# Patient Record
Sex: Female | Born: 1950 | Hispanic: No | Marital: Single | Smoking: Former smoker
Health system: Southern US, Community
[De-identification: ages and names within clinical notes are randomized; demographics above are authoritative.]

## PROBLEM LIST (undated history)

## (undated) DIAGNOSIS — K219 Gastro-esophageal reflux disease without esophagitis: Secondary | ICD-10-CM

## (undated) DIAGNOSIS — M1990 Unspecified osteoarthritis, unspecified site: Secondary | ICD-10-CM

## (undated) DIAGNOSIS — G709 Myoneural disorder, unspecified: Secondary | ICD-10-CM

## (undated) HISTORY — PX: VAGINAL HYSTERECTOMY: SUR661

## (undated) HISTORY — PX: BREAST EXCISIONAL BIOPSY: SUR124

## (undated) HISTORY — DX: Gastro-esophageal reflux disease without esophagitis: K21.9

## (undated) HISTORY — DX: Unspecified osteoarthritis, unspecified site: M19.90

## (undated) HISTORY — PX: COLONOSCOPY: SHX174

---

## 1998-08-12 ENCOUNTER — Ambulatory Visit (HOSPITAL_COMMUNITY): Admission: RE | Admit: 1998-08-12 | Discharge: 1998-08-12 | Payer: Self-pay | Admitting: Internal Medicine

## 1998-08-12 ENCOUNTER — Encounter: Payer: Self-pay | Admitting: Internal Medicine

## 1998-08-22 ENCOUNTER — Other Ambulatory Visit: Admission: RE | Admit: 1998-08-22 | Discharge: 1998-08-22 | Payer: Self-pay | Admitting: Obstetrics and Gynecology

## 1999-09-03 ENCOUNTER — Other Ambulatory Visit: Admission: RE | Admit: 1999-09-03 | Discharge: 1999-09-03 | Payer: Self-pay | Admitting: Obstetrics and Gynecology

## 2000-09-07 ENCOUNTER — Encounter: Admission: RE | Admit: 2000-09-07 | Discharge: 2000-09-07 | Payer: Self-pay | Admitting: Obstetrics and Gynecology

## 2000-09-07 ENCOUNTER — Encounter: Payer: Self-pay | Admitting: Obstetrics and Gynecology

## 2001-09-28 ENCOUNTER — Other Ambulatory Visit: Admission: RE | Admit: 2001-09-28 | Discharge: 2001-09-28 | Payer: Self-pay | Admitting: Obstetrics and Gynecology

## 2002-10-03 ENCOUNTER — Other Ambulatory Visit: Admission: RE | Admit: 2002-10-03 | Discharge: 2002-10-03 | Payer: Self-pay | Admitting: Obstetrics and Gynecology

## 2003-07-24 ENCOUNTER — Emergency Department (HOSPITAL_COMMUNITY): Admission: EM | Admit: 2003-07-24 | Discharge: 2003-07-24 | Payer: Self-pay | Admitting: Family Medicine

## 2004-03-24 ENCOUNTER — Ambulatory Visit: Payer: Self-pay | Admitting: Internal Medicine

## 2008-09-04 ENCOUNTER — Emergency Department: Payer: Self-pay | Admitting: Emergency Medicine

## 2008-09-05 ENCOUNTER — Emergency Department: Payer: Self-pay | Admitting: Emergency Medicine

## 2010-09-09 ENCOUNTER — Emergency Department: Payer: Self-pay | Admitting: Internal Medicine

## 2010-10-08 ENCOUNTER — Ambulatory Visit: Payer: Self-pay | Admitting: Specialist

## 2011-01-15 ENCOUNTER — Emergency Department: Payer: Self-pay | Admitting: Unknown Physician Specialty

## 2011-05-23 ENCOUNTER — Emergency Department: Payer: Self-pay | Admitting: *Deleted

## 2012-10-22 ENCOUNTER — Emergency Department: Payer: Self-pay | Admitting: Emergency Medicine

## 2013-01-15 ENCOUNTER — Ambulatory Visit: Payer: Self-pay | Admitting: General Practice

## 2013-01-23 ENCOUNTER — Other Ambulatory Visit: Payer: Self-pay | Admitting: Physician Assistant

## 2013-01-23 LAB — URINALYSIS, COMPLETE
Bacteria: NONE SEEN
Bilirubin,UR: NEGATIVE
Glucose,UR: NEGATIVE mg/dL (ref 0–75)
Ketone: NEGATIVE
Leukocyte Esterase: NEGATIVE
Nitrite: NEGATIVE
Ph: 5 (ref 4.5–8.0)
Protein: NEGATIVE
RBC,UR: 5 /HPF (ref 0–5)
Specific Gravity: 1.021 (ref 1.003–1.030)
Squamous Epithelial: 2
WBC UR: <1 /HPF (ref 0–5)

## 2013-01-24 LAB — URINE CULTURE

## 2013-02-07 ENCOUNTER — Ambulatory Visit: Payer: Self-pay | Admitting: Urology

## 2013-02-07 DIAGNOSIS — R109 Unspecified abdominal pain: Secondary | ICD-10-CM | POA: Insufficient documentation

## 2013-03-07 ENCOUNTER — Ambulatory Visit: Payer: Self-pay | Admitting: Family Medicine

## 2013-04-20 ENCOUNTER — Ambulatory Visit: Payer: Self-pay

## 2013-05-08 ENCOUNTER — Ambulatory Visit: Payer: Self-pay | Admitting: Family Medicine

## 2013-05-21 ENCOUNTER — Ambulatory Visit: Payer: Self-pay | Admitting: Family Medicine

## 2014-11-15 ENCOUNTER — Ambulatory Visit (INDEPENDENT_AMBULATORY_CARE_PROVIDER_SITE_OTHER): Payer: Self-pay | Admitting: Family Medicine

## 2014-11-15 ENCOUNTER — Encounter: Payer: Self-pay | Admitting: Family Medicine

## 2014-11-15 VITALS — BP 130/86 | HR 84 | Ht 67.0 in | Wt 172.0 lb

## 2014-11-15 DIAGNOSIS — N3001 Acute cystitis with hematuria: Secondary | ICD-10-CM

## 2014-11-15 DIAGNOSIS — S39012A Strain of muscle, fascia and tendon of lower back, initial encounter: Secondary | ICD-10-CM

## 2014-11-15 LAB — POCT URINALYSIS DIPSTICK
Bilirubin, UA: NEGATIVE
Glucose, UA: NEGATIVE
Ketones, UA: NEGATIVE
Leukocytes, UA: NEGATIVE
Nitrite, UA: NEGATIVE
Protein, UA: NEGATIVE
Spec Grav, UA: 1.01
Urobilinogen, UA: 0.2
pH, UA: 5

## 2014-11-15 MED ORDER — SULFAMETHOXAZOLE-TRIMETHOPRIM 800-160 MG PO TABS: 1.0000 | ORAL_TABLET | Freq: Two times a day (BID) | ORAL | Status: DC

## 2014-11-15 MED ORDER — CYCLOBENZAPRINE HCL 10 MG PO TABS: 10.0000 mg | ORAL_TABLET | Freq: Three times a day (TID) | ORAL | Status: DC | PRN

## 2014-11-15 NOTE — Progress Notes (Signed)
Name: Taylor Marshall   MRN: [id number]    DOB: 11/25/1950   Date:11/15/2014       Progress Note  Subjective  Chief Complaint  Chief Complaint  Patient presents with  . Urinary Tract Infection    lower abdominal pressure across the front and lower back pain. Nauseated and painful urination    Urinary Tract Infection  This is a new problem. The current episode started 1 to 4 weeks ago. The problem occurs every urination. The problem has been waxing and waning. The quality of the pain is described as burning. The pain is mild. There has been no fever. She is not sexually active. There is no history of pyelonephritis. Associated symptoms include chills, frequency and urgency. Pertinent negatives include no discharge, flank pain, hematuria, hesitancy, nausea or possible pregnancy. She has tried acetaminophen for the symptoms. The treatment provided mild relief.  Back Pain This is a new problem. The current episode started 1 to 4 weeks ago. The problem has been waxing and waning since onset. The pain is present in the gluteal. The pain is moderate. Associated symptoms include leg pain. Pertinent negatives include no abdominal pain, bladder incontinence, bowel incontinence, chest pain, dysuria, fever, headaches, paresthesias, tingling, weakness or weight loss.    No problem-specific assessment & plan notes found for this encounter.   No past medical history on file.  Past Surgical History  Procedure Laterality Date  . Vaginal hysterectomy    . Colonoscopy  2006?    Dr Maryruth BunKapur    No family history on file.  Social History   Social History  . Marital Status: Single    Spouse Name: N/A  . Number of Children: N/A  . Years of Education: N/A   Occupational History  . Not on file.   Social History Main Topics  . Smoking status: Former Games developermoker  . Smokeless tobacco: Not on file  . Alcohol Use: No  . Drug Use: No  . Sexual Activity: Not on file   Other Topics Concern  . Not on file    Social History Narrative  . No narrative on file    No Known Allergies   Review of Systems  Constitutional: Positive for chills. Negative for fever, weight loss and malaise/fatigue.  HENT: Negative for ear discharge, ear pain and sore throat.   Eyes: Negative for blurred vision.  Respiratory: Negative for cough, sputum production, shortness of breath and wheezing.   Cardiovascular: Negative for chest pain, palpitations and leg swelling.  Gastrointestinal: Negative for heartburn, nausea, abdominal pain, diarrhea, constipation, blood in stool, melena and bowel incontinence.  Genitourinary: Positive for urgency and frequency. Negative for bladder incontinence, dysuria, hesitancy, hematuria and flank pain.  Musculoskeletal: Positive for back pain. Negative for myalgias, joint pain and neck pain.  Skin: Negative for rash.  Neurological: Negative for dizziness, tingling, sensory change, focal weakness, weakness, headaches and paresthesias.  Endo/Heme/Allergies: Negative for environmental allergies and polydipsia. Does not bruise/bleed easily.  Psychiatric/Behavioral: Negative for depression and suicidal ideas. The patient is not nervous/anxious and does not have insomnia.      Objective  Filed Vitals:   11/15/14 1538  BP: 130/86  Pulse: 84  Height: 5\' 7"  (1.702 m)  Weight: 172 lb (78.019 kg)    Physical Exam  Constitutional: She is well-developed, well-nourished, and in no distress. No distress.  HENT:  Head: Normocephalic and atraumatic.  Right Ear: External ear normal.  Left Ear: External ear normal.  Nose: Nose normal.  Mouth/Throat: Oropharynx  is clear and moist.  Eyes: Conjunctivae and EOM are normal. Pupils are equal, round, and reactive to light. Right eye exhibits no discharge. Left eye exhibits no discharge.  Neck: Normal range of motion. Neck supple. No JVD present. No thyromegaly present.  Cardiovascular: Normal rate, regular rhythm, normal heart sounds and intact  distal pulses.  Exam reveals no gallop and no friction rub.   No murmur heard. Pulmonary/Chest: Effort normal and breath sounds normal.  Abdominal: Soft. Bowel sounds are normal. She exhibits no mass. There is no tenderness. There is no guarding.  Musculoskeletal: Normal range of motion. She exhibits no edema.  Lymphadenopathy:    She has no cervical adenopathy.  Neurological: She is alert.  Skin: Skin is warm and dry. She is not diaphoretic.  Psychiatric: Mood and affect normal.      Assessment & Plan  Problem List Items Addressed This Visit    None    Visit Diagnoses    Acute cystitis with hematuria    -  Primary    pt to call for appt with [facility]io[facility]    Relevant Medications    sulfamethoxazole-trimethoprim (BACTRIM DS,SEPTRA DS) 800-160 MG tablet    Other Relevant Orders    POCT Urinalysis Dipstick (Completed)    Lumbosacral strain, initial encounter        Relevant Medications    cyclobenzaprine (FLEXERIL) 10 MG tablet         Dr. [facility]  11/15/2014

## 2014-11-26 ENCOUNTER — Other Ambulatory Visit: Payer: Self-pay

## 2014-11-27 ENCOUNTER — Encounter: Payer: Self-pay | Admitting: Family Medicine

## 2014-11-27 ENCOUNTER — Ambulatory Visit (INDEPENDENT_AMBULATORY_CARE_PROVIDER_SITE_OTHER): Payer: Self-pay | Admitting: Family Medicine

## 2014-11-27 ENCOUNTER — Other Ambulatory Visit
Admission: RE | Admit: 2014-11-27 | Discharge: 2014-11-27 | Disposition: A | Discharge status: Home / Self Care | Payer: Self-pay | Source: Ambulatory Visit | Attending: Family Medicine | Admitting: Family Medicine

## 2014-11-27 VITALS — BP 120/80 | HR 80 | Ht 67.0 in | Wt 179.0 lb

## 2014-11-27 DIAGNOSIS — R7989 Other specified abnormal findings of blood chemistry: Secondary | ICD-10-CM | POA: Insufficient documentation

## 2014-11-27 DIAGNOSIS — R110 Nausea: Secondary | ICD-10-CM | POA: Insufficient documentation

## 2014-11-27 DIAGNOSIS — K8500 Idiopathic acute pancreatitis without necrosis or infection: Secondary | ICD-10-CM

## 2014-11-27 DIAGNOSIS — K219 Gastro-esophageal reflux disease without esophagitis: Secondary | ICD-10-CM

## 2014-11-27 DIAGNOSIS — R1031 Right lower quadrant pain: Secondary | ICD-10-CM | POA: Insufficient documentation

## 2014-11-27 LAB — RENAL FUNCTION PANEL
Albumin: 4 g/dL (ref 3.5–5.0)
Anion gap: 7 (ref 5–15)
BUN: 8 mg/dL (ref 6–20)
CO2: 27 mmol/L (ref 22–32)
Calcium: 10.1 mg/dL (ref 8.9–10.3)
Chloride: 107 mmol/L (ref 101–111)
Creatinine, Ser: 0.87 mg/dL (ref 0.44–1.00)
GFR calc Af Amer: >60 mL/min (ref >60)
GFR calc non Af Amer: >60 mL/min (ref >60)
Glucose, Bld: 104 mg/dL — ABNORMAL HIGH (ref 65–99)
Phosphorus: 3.3 mg/dL (ref 2.5–4.6)
Potassium: 4.9 mmol/L (ref 3.5–5.1)
Sodium: 141 mmol/L (ref 135–145)

## 2014-11-27 LAB — AMYLASE: Amylase: 84 U/L (ref 28–100)

## 2014-11-27 LAB — HEPATIC FUNCTION PANEL
ALT: 18 U/L (ref 14–54)
AST: 27 U/L (ref 15–41)
Albumin: 4 g/dL (ref 3.5–5.0)
Alkaline Phosphatase: 80 U/L (ref 38–126)
Bilirubin, Direct: <0.1 mg/dL — ABNORMAL LOW (ref 0.1–0.5)
Total Bilirubin: 0.3 mg/dL (ref 0.3–1.2)
Total Protein: 7.4 g/dL (ref 6.5–8.1)

## 2014-11-27 LAB — CBC
HCT: 40.7 % (ref 35.0–47.0)
Hemoglobin: 13.4 g/dL (ref 12.0–16.0)
MCH: 27.1 pg (ref 26.0–34.0)
MCHC: 32.9 g/dL (ref 32.0–36.0)
MCV: 82.3 fL (ref 80.0–100.0)
Platelets: 226 10*3/uL (ref 150–440)
RBC: 4.94 MIL/uL (ref 3.80–5.20)
RDW: 12.9 % (ref 11.5–14.5)
WBC: 7.9 10*3/uL (ref 3.6–11.0)

## 2014-11-27 LAB — LIPASE, BLOOD: Lipase: 24 U/L (ref 11–51)

## 2014-11-27 MED ORDER — PANTOPRAZOLE SODIUM 40 MG PO TBEC: 40.0000 mg | DELAYED_RELEASE_TABLET | Freq: Every day | ORAL | Status: DC

## 2014-11-27 NOTE — Progress Notes (Signed)
Name: Taylor PickerDeborah A Peregoy   MRN: [id number]    DOB: 12/26/1950   Date:11/27/2014       Progress Note  Subjective  Chief Complaint  Chief Complaint  Patient presents with  . Follow-up    ER visit- pain on R) side radiating to back- no kidney stones. Lipase elevated    Abdominal Pain This is a recurrent problem. The current episode started 1 to 4 weeks ago. The onset quality is gradual. The problem occurs daily. The problem has been waxing and waning. The pain is located in the RUQ. The pain is at a severity of 4/10. The pain is moderate. The quality of the pain is colicky and aching ("comes in waves"). Associated symptoms include anorexia, hematuria, nausea and vomiting. Pertinent negatives include no belching, constipation, diarrhea, dysuria, fever, frequency, headaches, hematochezia, melena, myalgias or weight loss. Associated symptoms comments: [facility] Exacerbated by: zofran/hydrocodone. She has tried oral narcotic analgesics and acetaminophen for the symptoms. The treatment provided mild relief. Her past medical history is significant for pancreatitis. There is no history of gallstones.    No problem-specific assessment & plan notes found for this encounter.   History reviewed. No pertinent past medical history.  Past Surgical History  Procedure Laterality Date  . Vaginal hysterectomy    . Colonoscopy  2006?    Dr Maryruth BunKapur    History reviewed. No pertinent family history.  Social History   Social History  . Marital Status: Single    Spouse Name: N/A  . Number of Children: N/A  . Years of Education: N/A   Occupational History  . Not on file.   Social History Main Topics  . Smoking status: Former Games developermoker  . Smokeless tobacco: Not on file  . Alcohol Use: No  . Drug Use: No  . Sexual Activity: Not Currently   Other Topics Concern  . Not on file   Social History Narrative    Allergies  Allergen Reactions  . Morphine And Related Nausea Only      Review of Systems  Constitutional: Negative for fever, chills, weight loss and malaise/fatigue.  HENT: Negative for ear discharge, ear pain and sore throat.   Eyes: Negative for blurred vision.  Respiratory: Negative for cough, sputum production, shortness of breath and wheezing.   Cardiovascular: Negative for chest pain, palpitations and leg swelling.  Gastrointestinal: Positive for heartburn, nausea, vomiting, abdominal pain and anorexia. Negative for diarrhea, constipation, blood in stool, melena and hematochezia.  Genitourinary: Positive for hematuria. Negative for dysuria, urgency and frequency.  Musculoskeletal: Negative for myalgias, back pain, joint pain and neck pain.  Skin: Negative for rash.  Neurological: Negative for dizziness, tingling, sensory change, focal weakness and headaches.  Endo/Heme/Allergies: Negative for environmental allergies and polydipsia. Does not bruise/bleed easily.  Psychiatric/Behavioral: Negative for depression and suicidal ideas. The patient is not nervous/anxious and does not have insomnia.      Objective  Filed Vitals:   11/27/14 0831  BP: 120/80  Pulse: 80  Height: 5\' 7"  (1.702 m)  Weight: 179 lb (81.194 kg)    Physical Exam  Constitutional: She is well-developed, well-nourished, and in no distress. No distress.  HENT:  Head: Normocephalic and atraumatic.  Right Ear: External ear normal.  Left Ear: External ear normal.  Nose: Nose normal.  Mouth/Throat: Oropharynx is clear and moist.  Eyes: Conjunctivae and EOM are normal. Pupils are equal, round, and reactive to light. Right eye exhibits no discharge. Left eye exhibits no discharge.  Neck: Normal  range of motion. Neck supple. No JVD present. No thyromegaly present.  Cardiovascular: Normal rate, regular rhythm, normal heart sounds and intact distal pulses.  Exam reveals no gallop and no friction rub.   No murmur heard. Pulmonary/Chest: Effort normal and breath sounds normal.   Abdominal: Soft. Bowel sounds are normal. She exhibits no distension and no mass. There is tenderness. There is rebound. There is no guarding.  Musculoskeletal: Normal range of motion. She exhibits no edema.  Lymphadenopathy:    She has no cervical adenopathy.  Neurological: She is alert. She has normal reflexes.  Skin: Skin is warm and dry. She is not diaphoretic.  Psychiatric: Mood and affect normal.      Assessment & Plan  Problem List Items Addressed This Visit    None    Visit Diagnoses    Idiopathic acute pancreatitis, unspecified complication status    -  Primary    discussed concerns and need to hosp for NPO/ pt refuses    Relevant Medications    oxyCODONE (OXY IR/ROXICODONE) 5 MG immediate release tablet    pantoprazole (PROTONIX) 40 MG tablet    Other Relevant Orders    CBC with Differential    Lipase    Hepatic function panel    Renal Function Panel    Gastroesophageal reflux disease, esophagitis presence not specified        Relevant Medications    pantoprazole (PROTONIX) 40 MG tablet         Dr. [facility]  11/27/2014

## 2014-11-27 NOTE — Patient Instructions (Signed)

## 2016-01-10 IMAGING — CT CT STONE STUDY
2 of 4 series · 16 of 46 positions shown, 18 images · non-contrast
Comparison: None.

CLINICAL DATA: Left flank pain and hematuria.

EXAM:
CT ABDOMEN AND PELVIS WITHOUT CONTRAST
TECHNIQUE: Multidetector CT imaging of the abdomen and pelvis was performed
following the standard protocol without IV contrast.

[Series 2: stone standard full · axial · 0.71mm/px · z∈[-554,-159]mm · 13 of 87 slices shown, 15 images]
[im 4/87  soft-tissue]
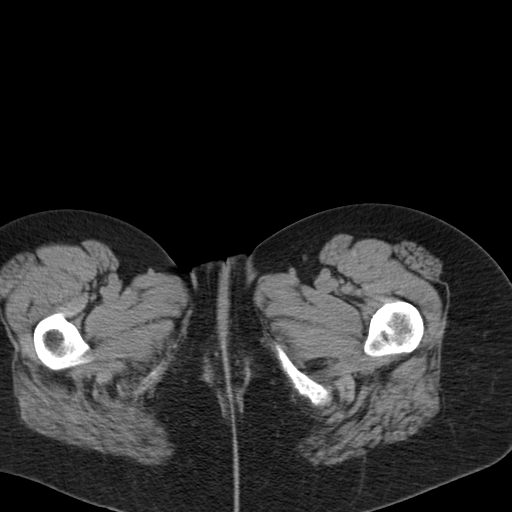
[im 4/87  bone]
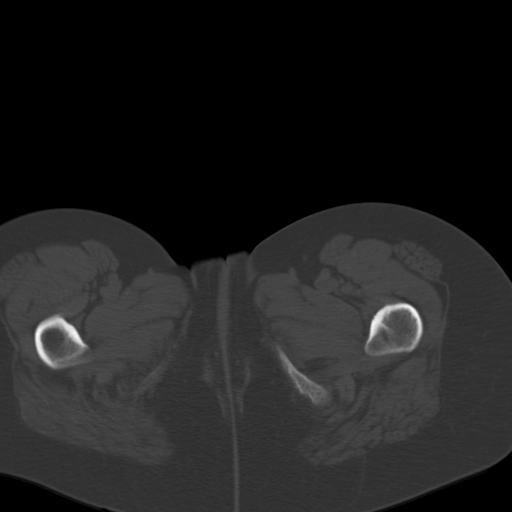
[im 11/87  soft-tissue]
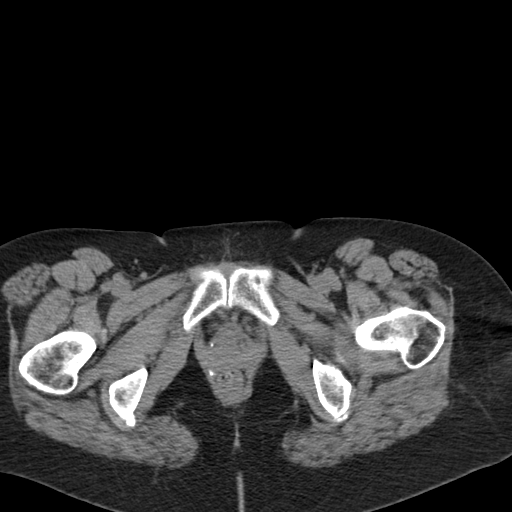
[im 18/87  soft-tissue]
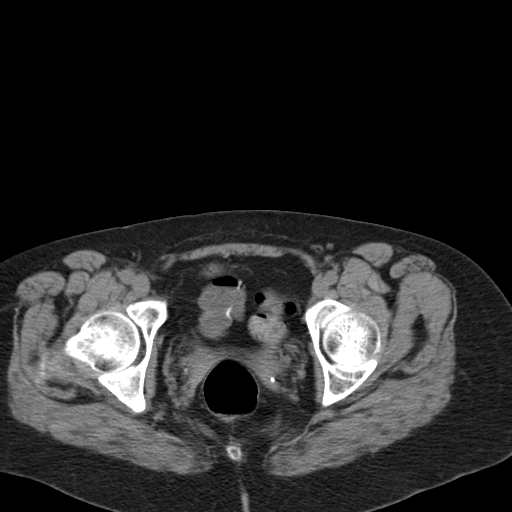
[im 26/87  soft-tissue]
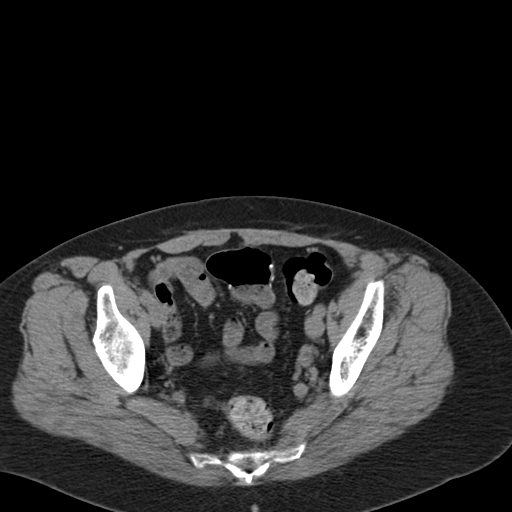
[im 29/87  soft-tissue]
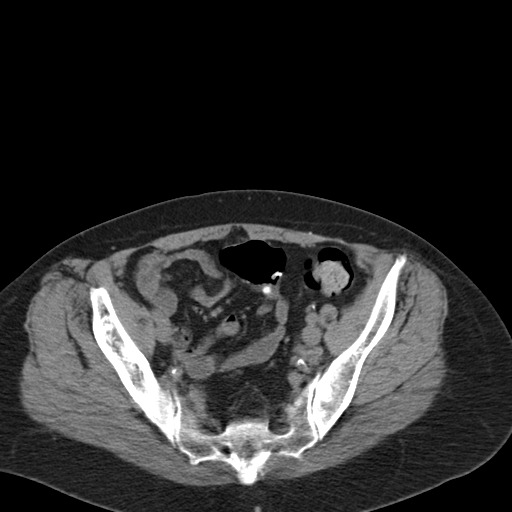
[im 36/87  soft-tissue]
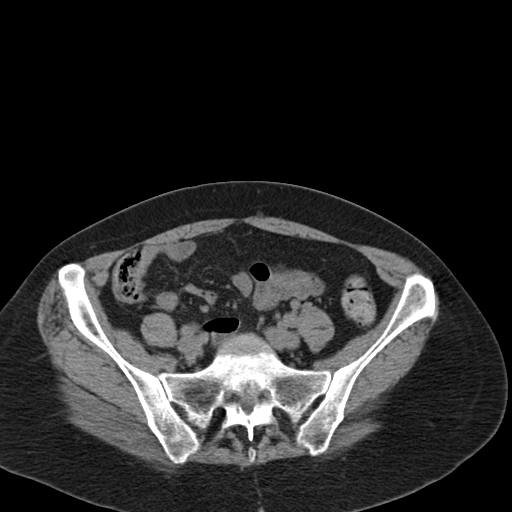
[im 44/87  soft-tissue]
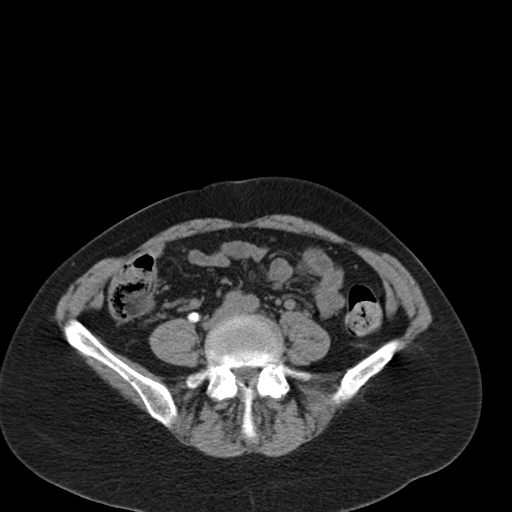
[im 51/87  soft-tissue]
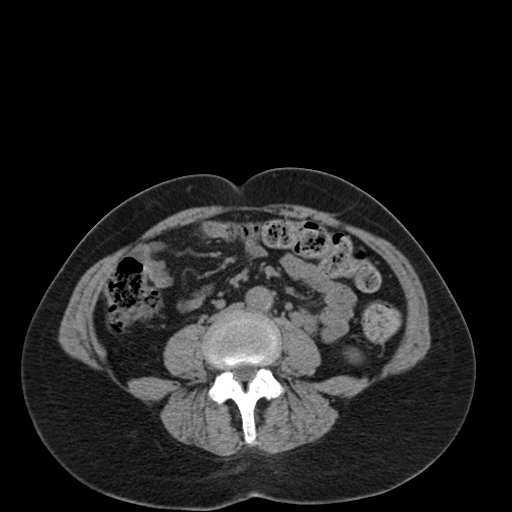
[im 58/87  soft-tissue]
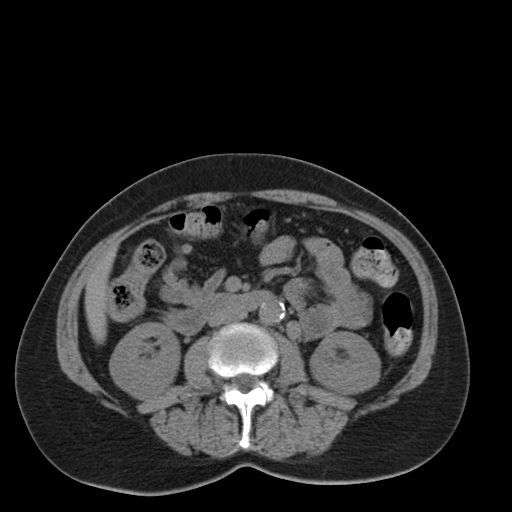
[im 58/87  bone]
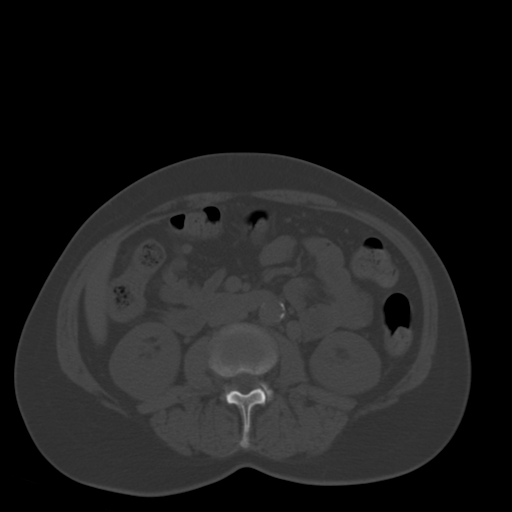
[im 61/87  soft-tissue]
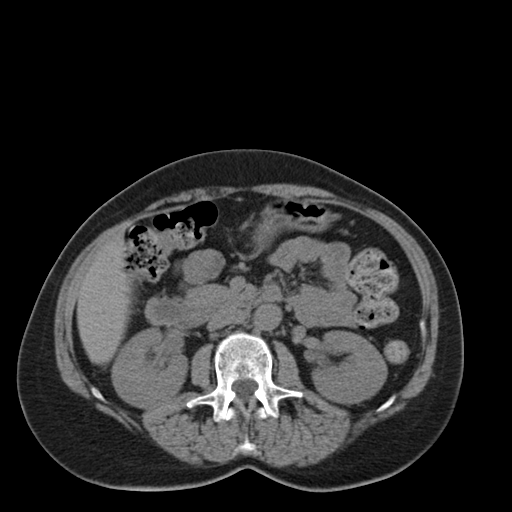
[im 69/87  soft-tissue]
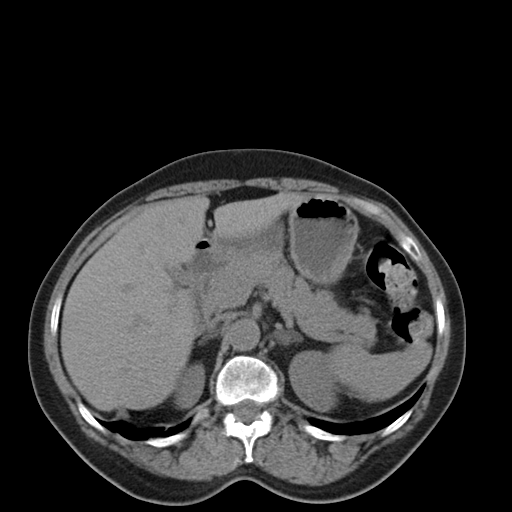
[im 76/87  soft-tissue]
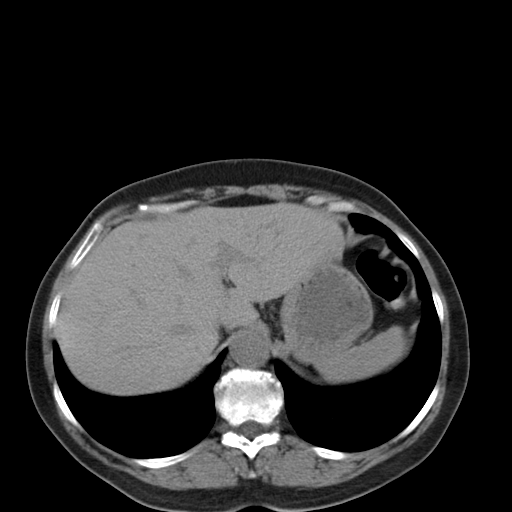
[im 83/87  soft-tissue]
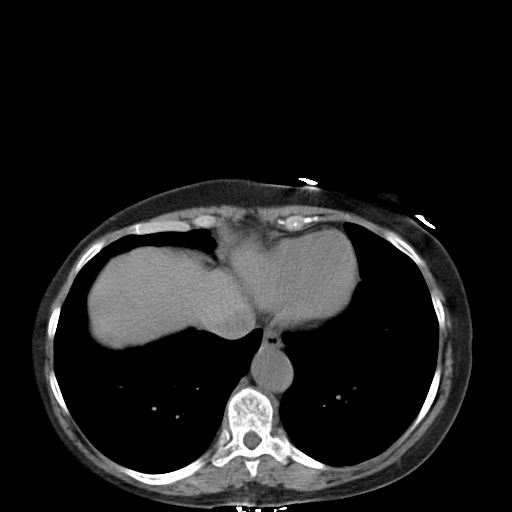

[Series 5: cor stone standard full · coronal · 0.75mm/px · 3 of 134 slices shown]
[im 45/134  soft-tissue]
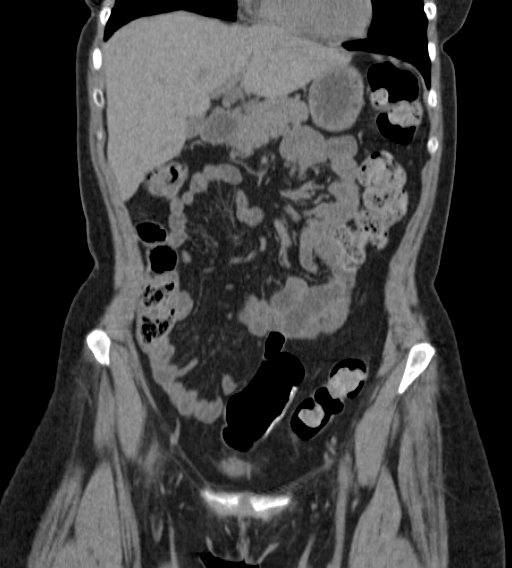
[im 60/134  soft-tissue]
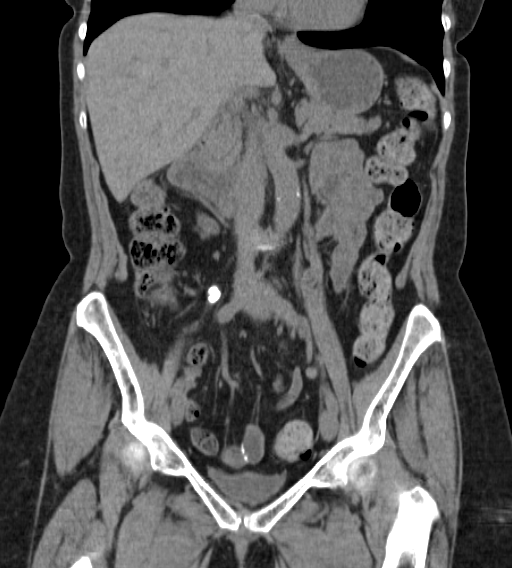
[im 74/134  soft-tissue]
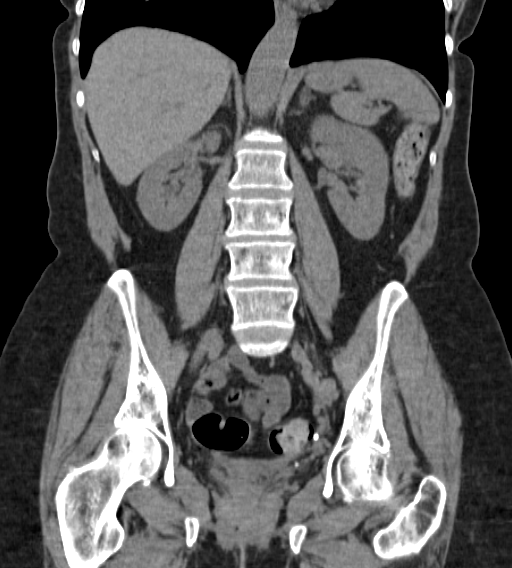

[16 of 46 positions shown; findings below may reference images not displayed]

FINDINGS: Lung Bases: Normal.

Liver: Unenhanced CT was performed per clinician order. Lack of IV
contrast limits sensitivity and specificity, especially for
evaluation of abdominal/pelvic solid viscera. Grossly normal.

Spleen:  Normal.

Gallbladder:  Partially contracted.

Common bile duct:  Normal.

Pancreas:  Normal.

Adrenal glands: Left adrenal adenoma measuring 14 mm. Right adrenal
gland normal.

Kidneys: Normal. No hydronephrosis. Normal appearance of the kidneys
bilaterally. Both ureters appear within normal limits. Large
phlebolith in the right gonadal vein adjacent to the right ureter.

Stomach:  Grossly normal.

Small bowel: Normal. No adenopathy. No obstruction. Side to side
anastomosis is present in the anatomic pelvis centrally and
anteriorly. No complicating features.

Colon:   Appendix not identified.  No colonic inflammatory changes.

Pelvic Genitourinary: Urinary bladder collapsed. No adenopathy. No
free fluid. Hysterectomy.

Bones:  No aggressive osseous lesions.

Vasculature: Atherosclerosis. No acute vascular abnormality allowing
for noncontrast technique.

Body Wall: Fat containing periumbilical hernia.
IMPRESSION: 1. Negative for urolithiasis.
2. Left adrenal adenoma.
3. Postsurgical changes of small bowel in the anatomic pelvis. No
obstruction or complicating features.

## 2017-03-28 ENCOUNTER — Ambulatory Visit (INDEPENDENT_AMBULATORY_CARE_PROVIDER_SITE_OTHER): Payer: Self-pay | Admitting: Family Medicine

## 2017-03-28 ENCOUNTER — Encounter: Payer: Self-pay | Admitting: Family Medicine

## 2017-03-28 VITALS — BP 120/80 | HR 72 | Ht 67.0 in | Wt 171.0 lb

## 2017-03-28 DIAGNOSIS — J029 Acute pharyngitis, unspecified: Secondary | ICD-10-CM

## 2017-03-28 DIAGNOSIS — H6981 Other specified disorders of Eustachian tube, right ear: Secondary | ICD-10-CM

## 2017-03-28 DIAGNOSIS — J0100 Acute maxillary sinusitis, unspecified: Secondary | ICD-10-CM

## 2017-03-28 MED ORDER — AMOXICILLIN 500 MG PO CAPS: 500.0000 mg | ORAL_CAPSULE | Freq: Three times a day (TID) | ORAL | 0 refills | Status: DC

## 2017-03-28 NOTE — Progress Notes (Signed)
Name: Taylor Marshall   MRN: [name]    DOB: 01/22/1950   Date:03/28/2017       Progress Note  Subjective  Chief Complaint  Chief Complaint  Patient presents with  . Sore Throat    R) ear pain- hurts to swallow- started couple of days ago, but got worse last night    Sore Throat   This is a new problem. The current episode started in the past 7 days ("couple of days ago"). The problem has been waxing and waning. The pain is worse on the right side. There has been no fever. The fever has been present for 1 to 2 days. The pain is moderate. Associated symptoms include congestion, coughing, ear pain and trouble swallowing. Pertinent negatives include no abdominal pain, diarrhea, drooling, ear discharge, headaches, hoarse voice, plugged ear sensation, neck pain, shortness of breath, stridor, swollen glands or vomiting. She has had no exposure to strep or mono. She has tried nothing for the symptoms.  Otalgia   There is pain in the right ear. This is a new problem. The current episode started in the past 7 days. The problem has been waxing and waning. There has been no fever. The pain is at a severity of 7/10. The pain is moderate. Associated symptoms include coughing. Pertinent negatives include no abdominal pain, diarrhea, ear discharge, headaches, hearing loss, neck pain, rash, rhinorrhea, sore throat or vomiting. She has tried acetaminophen for the symptoms. The treatment provided moderate relief. There is no history of hearing loss.  Sinusitis  This is a new problem. The current episode started in the past 7 days. The problem has been gradually worsening since onset. There has been no fever. Associated symptoms include congestion, coughing, diaphoresis, ear pain, sinus pressure and sneezing. Pertinent negatives include no chills, headaches, hoarse voice, neck pain, shortness of breath, sore throat or swollen glands. Past treatments include nothing.    No problem-specific Assessment & Plan  notes found for this encounter.   History reviewed. No pertinent past medical history.  Past Surgical History:  Procedure Laterality Date  . COLONOSCOPY  2006?   Dr Maryruth BunKapur  . VAGINAL HYSTERECTOMY      History reviewed. No pertinent family history.  Social History   Socioeconomic History  . Marital status: Single    Spouse name: Not on file  . Number of children: Not on file  . Years of education: Not on file  . Highest education level: Not on file  Occupational History  . Not on file  Social Needs  . Financial resource strain: Not on file  . Food insecurity:    Worry: Not on file    Inability: Not on file  . Transportation needs:    Medical: Not on file    Non-medical: Not on file  Tobacco Use  . Smoking status: Former Games developermoker  . Smokeless tobacco: Never Used  Substance and Sexual Activity  . Alcohol use: No    Alcohol/week: 0.0 oz  . Drug use: No  . Sexual activity: Not Currently  Lifestyle  . Physical activity:    Days per week: Not on file    Minutes per session: Not on file  . Stress: Not on file  Relationships  . Social connections:    Talks on phone: Not on file    Gets together: Not on file    Attends religious service: Not on file    Active member of club or organization: Not on file    Attends  meetings of clubs or organizations: Not on file    Relationship status: Not on file  . Intimate partner violence:    Fear of current or ex partner: Not on file    Emotionally abused: Not on file    Physically abused: Not on file    Forced sexual activity: Not on file  Other Topics Concern  . Not on file  Social History Narrative  . Not on file    Allergies  Allergen Reactions  . Morphine And Related Nausea Only    Outpatient Medications Prior to Visit  Medication Sig Dispense Refill  . cyclobenzaprine (FLEXERIL) 10 MG tablet Take 1 tablet (10 mg total) by mouth 3 (three) times daily as needed for muscle spasms. (Patient not taking: Reported on  11/27/2014) 30 tablet 0  . pantoprazole (PROTONIX) 40 MG tablet Take 1 tablet (40 mg total) by mouth daily. 30 tablet 3  . promethazine (PHENERGAN) 25 MG tablet Take 25 mg by mouth.     No facility-administered medications prior to visit.     Review of Systems  Constitutional: Positive for diaphoresis. Negative for chills, fever, malaise/fatigue and weight loss.  HENT: Positive for congestion, ear pain, sinus pressure, sneezing and trouble swallowing. Negative for drooling, ear discharge, hearing loss, hoarse voice, rhinorrhea and sore throat.   Eyes: Negative for blurred vision.  Respiratory: Positive for cough. Negative for sputum production, shortness of breath, wheezing and stridor.   Cardiovascular: Negative for chest pain, palpitations and leg swelling.  Gastrointestinal: Negative for abdominal pain, blood in stool, constipation, diarrhea, heartburn, melena, nausea and vomiting.  Genitourinary: Negative for dysuria, frequency, hematuria and urgency.  Musculoskeletal: Negative for back pain, joint pain, myalgias and neck pain.  Skin: Negative for rash.  Neurological: Negative for dizziness, tingling, sensory change, focal weakness and headaches.  Endo/Heme/Allergies: Negative for environmental allergies and polydipsia. Does not bruise/bleed easily.  Psychiatric/Behavioral: Negative for depression and suicidal ideas. The patient is not nervous/anxious and does not have insomnia.      Objective  Vitals:   03/28/17 0957  BP: 120/80  Pulse: 72  Weight: 171 lb (77.6 kg)  Height: 5\' 7"  (1.702 m)    Physical Exam  Constitutional: She is well-developed, well-nourished, and in no distress. No distress.  HENT:  Head: Normocephalic and atraumatic.  Right Ear: External ear and ear canal normal. Tympanic membrane is retracted.  Left Ear: External ear and ear canal normal. Tympanic membrane is retracted.  Nose: Right sinus exhibits maxillary sinus tenderness. Right sinus exhibits no  frontal sinus tenderness. Left sinus exhibits maxillary sinus tenderness. Left sinus exhibits no frontal sinus tenderness.  Mouth/Throat: Posterior oropharyngeal erythema present. No oropharyngeal exudate or posterior oropharyngeal edema.  Eyes: Pupils are equal, round, and reactive to light. Conjunctivae and EOM are normal. Right eye exhibits no discharge. Left eye exhibits no discharge.  Neck: Normal range of motion. Neck supple. No JVD present. No thyromegaly present.  Cardiovascular: Normal rate, regular rhythm, normal heart sounds and intact distal pulses. Exam reveals no gallop and no friction rub.  No murmur heard. Pulmonary/Chest: Effort normal and breath sounds normal. She has no wheezes. She has no rales.  Abdominal: Soft. Bowel sounds are normal. She exhibits no mass. There is no tenderness. There is no guarding.  Musculoskeletal: Normal range of motion. She exhibits no edema.  Lymphadenopathy:    She has no cervical adenopathy.  Neurological: She is alert. She has normal reflexes.  Skin: Skin is warm and dry. She is not  diaphoretic.  Psychiatric: Mood and affect normal.  Nursing note and vitals reviewed.     Assessment & Plan  Problem List Items Addressed This Visit    None    Visit Diagnoses    Acute maxillary sinusitis, recurrence not specified    -  Primary   Relevant Medications   amoxicillin (AMOXIL) 500 MG capsule   Pharyngitis, unspecified etiology       Dysfunction of right eustachian tube          Meds ordered this encounter  Medications  . amoxicillin (AMOXIL) 500 MG capsule    Sig: Take 1 capsule (500 mg total) by mouth 3 (three) times daily.    Dispense:  30 capsule    Refill:  0      Dr. Elizabeth Sauer [facility]  03/28/17

## 2017-04-29 DIAGNOSIS — J111 Influenza due to unidentified influenza virus with other respiratory manifestations: Secondary | ICD-10-CM | POA: Diagnosis not present

## 2017-04-29 DIAGNOSIS — J209 Acute bronchitis, unspecified: Secondary | ICD-10-CM | POA: Diagnosis not present

## 2017-04-29 DIAGNOSIS — J019 Acute sinusitis, unspecified: Secondary | ICD-10-CM | POA: Diagnosis not present

## 2017-11-11 ENCOUNTER — Encounter: Payer: Self-pay | Admitting: Family Medicine

## 2017-11-11 ENCOUNTER — Ambulatory Visit (INDEPENDENT_AMBULATORY_CARE_PROVIDER_SITE_OTHER): Payer: Medicare Other | Admitting: Family Medicine

## 2017-11-11 VITALS — BP 110/60 | HR 64 | Ht 67.0 in | Wt 163.0 lb

## 2017-11-11 DIAGNOSIS — J0100 Acute maxillary sinusitis, unspecified: Secondary | ICD-10-CM | POA: Diagnosis not present

## 2017-11-11 MED ORDER — AMOXICILLIN 500 MG PO CAPS: 500.0000 mg | ORAL_CAPSULE | Freq: Three times a day (TID) | ORAL | 0 refills | Status: DC

## 2017-11-11 NOTE — Progress Notes (Signed)
Date:  11/11/2017   Name:  Taylor Marshall   DOB:  08-30-1950   MRN:  [id number]   Chief Complaint: Sinusitis (facial pain and drainage, hurts to swallow) Sinusitis  This is a new problem. The current episode started yesterday. The problem has been gradually worsening since onset. There has been no fever. The pain is moderate. Associated symptoms include chills, congestion, diaphoresis, headaches, a hoarse voice, neck pain, sinus pressure, sneezing and a sore throat. Pertinent negatives include no coughing, ear pain or shortness of breath. Past treatments include spray decongestants (mucinex dm). The treatment provided no relief.     Review of Systems  Constitutional: Positive for chills and diaphoresis. Negative for fatigue, fever and unexpected weight change.  HENT: Positive for congestion, hoarse voice, sinus pressure, sneezing and sore throat. Negative for ear discharge, ear pain and rhinorrhea.   Eyes: Negative for photophobia, pain, discharge, redness and itching.  Respiratory: Negative for cough, shortness of breath, wheezing and stridor.   Gastrointestinal: Negative for abdominal pain, blood in stool, constipation, diarrhea, nausea and vomiting.  Endocrine: Negative for cold intolerance, heat intolerance, polydipsia, polyphagia and polyuria.  Genitourinary: Negative for dysuria, flank pain, frequency, hematuria, menstrual problem, pelvic pain, urgency, vaginal bleeding and vaginal discharge.  Musculoskeletal: Positive for neck pain. Negative for arthralgias, back pain and myalgias.  Skin: Negative for rash.  Allergic/Immunologic: Negative for environmental allergies and food allergies.  Neurological: Positive for headaches. Negative for dizziness, weakness, light-headedness and numbness.  Hematological: Negative for adenopathy. Does not bruise/bleed easily.  Psychiatric/Behavioral: Negative for dysphoric mood. The patient is not nervous/anxious.     There are no active  problems to display for this patient.   Allergies  Allergen Reactions  . Morphine And Related Nausea Only    Past Surgical History:  Procedure Laterality Date  . COLONOSCOPY  2006?   Dr Maryruth Bun  . VAGINAL HYSTERECTOMY      Social History   Tobacco Use  . Smoking status: Former Games developer  . Smokeless tobacco: Never Used  Substance Use Topics  . Alcohol use: No    Alcohol/week: 0.0 standard drinks  . Drug use: No     Medication list has been reviewed and updated.  No outpatient medications have been marked as taking for the 11/11/17 encounter (Office Visit) with Duanne Limerick, MD.    [facility] 2/9 Scores 03/28/2017  PHQ - 2 Score 2  PHQ- 9 Score 12    Physical Exam  Constitutional: No distress.  HENT:  Head: Normocephalic and atraumatic.  Right Ear: Hearing, tympanic membrane, external ear and ear canal normal.  Left Ear: Hearing, tympanic membrane, external ear and ear canal normal.  Nose: Right sinus exhibits maxillary sinus tenderness. Right sinus exhibits no frontal sinus tenderness. Left sinus exhibits maxillary sinus tenderness. Left sinus exhibits no frontal sinus tenderness.  Mouth/Throat: Uvula is midline, oropharynx is clear and moist and mucous membranes are normal.  Eyes: Pupils are equal, round, and reactive to light. Conjunctivae and EOM are normal. Right eye exhibits no discharge. Left eye exhibits no discharge.  Neck: Normal range of motion. Neck supple. No JVD present. No thyromegaly present.  Cardiovascular: Normal rate, regular rhythm, normal heart sounds and intact distal pulses. Exam reveals no gallop and no friction rub.  No murmur heard. Pulmonary/Chest: Effort normal and breath sounds normal. She has no decreased breath sounds. She has no wheezes. She has no rhonchi. She has no rales.  Abdominal: Soft. Bowel sounds are normal. She exhibits  no mass. There is no tenderness. There is no guarding.  Musculoskeletal: Normal range of motion. She exhibits no  edema.  Lymphadenopathy:    She has no cervical adenopathy.  Neurological: She is alert. She has normal reflexes.  Skin: Skin is warm and dry. She is not diaphoretic.  Nursing note and vitals reviewed.   BP 110/60   Pulse 64   Ht 5\' 7"  (1.702 m)   Wt 163 lb (73.9 kg)   BMI 25.53 kg/m   Assessment and Plan:  1. Acute maxillary sinusitis, recurrence not specified Acute. Seasonal. Initiate amoxil 500 mg tid for 10 days. - amoxicillin (AMOXIL) 500 MG capsule; Take 1 capsule (500 mg total) by mouth 3 (three) times daily.  Dispense: 30 capsule; Refill: 0   Dr. [facility]  11/11/2017

## 2017-11-28 ENCOUNTER — Ambulatory Visit: Payer: Medicare Other

## 2019-02-11 ENCOUNTER — Ambulatory Visit: Payer: Medicare Other | Attending: Internal Medicine

## 2019-02-11 DIAGNOSIS — Z23 Encounter for immunization: Secondary | ICD-10-CM | POA: Insufficient documentation

## 2019-02-11 NOTE — Progress Notes (Signed)
   Covid-19 Vaccination Clinic  Name:  PARILEE HALLY    MRN: [id number] DOB: September 25, 1950  02/11/2019  Ms. Cake was observed post Covid-19 immunization for 15 minutes without incidence. She was provided with Vaccine Information Sheet and instruction to access the V-Safe system.   Ms. Bahar was instructed to call 911 with any severe reactions post vaccine: Marland Kitchen Difficulty breathing  . Swelling of your face and throat  . A fast heartbeat  . A bad rash all over your body  . Dizziness and weakness    Immunizations Administered    Name Date Dose VIS Date Route   Pfizer COVID-19 Vaccine 02/11/2019 11:55 AM 0.3 mL 12/15/2018 Intramuscular   Manufacturer: [employer], [employer]   Lot: [id number]   NDC: [address]-8

## 2019-03-07 ENCOUNTER — Ambulatory Visit: Payer: Medicare Other | Attending: Internal Medicine

## 2019-03-07 DIAGNOSIS — Z23 Encounter for immunization: Secondary | ICD-10-CM | POA: Insufficient documentation

## 2019-03-07 NOTE — Progress Notes (Signed)
   Covid-19 Vaccination Clinic  Name:  Taylor Marshall    MRN: [id number] DOB: 02-14-1950  03/07/2019  [name] was observed post Covid-19 immunization for 15 minutes without incident. She was provided with Vaccine Information Sheet and instruction to access the V-Safe system.   [name] was instructed to call 911 with any severe reactions post vaccine: Marland Kitchen Difficulty breathing  . Swelling of face and throat  . A fast heartbeat  . A bad rash all over body  . Dizziness and weakness   Immunizations Administered    Name Date Dose VIS Date Route   Pfizer COVID-19 Vaccine 03/07/2019  8:48 AM 0.3 mL 12/15/2018 Intramuscular   Manufacturer: [employer], [employer]   Lot: [id number]   NDC: [address]-4

## 2019-10-09 DIAGNOSIS — Z23 Encounter for immunization: Secondary | ICD-10-CM | POA: Diagnosis not present

## 2019-12-25 ENCOUNTER — Telehealth: Payer: Self-pay

## 2019-12-25 NOTE — Telephone Encounter (Signed)
Pt called back and I relayed the message about scheduling an appt as it has been almost 3 yrs since she has been seen. I think pt hung up on me.  I tried to call her back and it went straight to VM  [phone]

## 2019-12-25 NOTE — Telephone Encounter (Signed)
Patient has not been seen since 2019 and its almost been 3 years. She needs to schedule an appt with Dr Yetta Barre to stay established as a patient. Please call her to schedule a CPE when next available.  Thank you.

## 2019-12-25 NOTE — Telephone Encounter (Signed)
Left message with husband, will have her call back

## 2020-01-01 ENCOUNTER — Ambulatory Visit
Admission: RE | Admit: 2020-01-01 | Discharge: 2020-01-01 | Disposition: A | Discharge status: Home / Self Care | Payer: Medicare Other | Source: Ambulatory Visit | Attending: Family Medicine | Admitting: Family Medicine

## 2020-01-01 ENCOUNTER — Ambulatory Visit
Admission: RE | Admit: 2020-01-01 | Discharge: 2020-01-01 | Disposition: A | Discharge status: Home / Self Care | Payer: Medicare Other | Attending: Family Medicine | Admitting: Family Medicine

## 2020-01-01 ENCOUNTER — Ambulatory Visit (INDEPENDENT_AMBULATORY_CARE_PROVIDER_SITE_OTHER): Payer: Medicare Other | Admitting: Family Medicine

## 2020-01-01 ENCOUNTER — Other Ambulatory Visit: Payer: Self-pay

## 2020-01-01 ENCOUNTER — Encounter: Payer: Self-pay | Admitting: Family Medicine

## 2020-01-01 VITALS — BP 132/98 | HR 80 | Ht 67.0 in | Wt 173.0 lb

## 2020-01-01 DIAGNOSIS — M79621 Pain in right upper arm: Secondary | ICD-10-CM | POA: Diagnosis not present

## 2020-01-01 DIAGNOSIS — M79601 Pain in right arm: Secondary | ICD-10-CM

## 2020-01-01 DIAGNOSIS — M542 Cervicalgia: Secondary | ICD-10-CM

## 2020-01-01 DIAGNOSIS — Z23 Encounter for immunization: Secondary | ICD-10-CM | POA: Diagnosis not present

## 2020-01-01 DIAGNOSIS — R030 Elevated blood-pressure reading, without diagnosis of hypertension: Secondary | ICD-10-CM | POA: Diagnosis not present

## 2020-01-01 DIAGNOSIS — G8929 Other chronic pain: Secondary | ICD-10-CM | POA: Diagnosis not present

## 2020-01-01 DIAGNOSIS — R519 Headache, unspecified: Secondary | ICD-10-CM | POA: Diagnosis not present

## 2020-01-01 NOTE — Progress Notes (Addendum)
Date:  01/01/2020   Name:  Taylor Marshall   DOB:  December 08, 1950   MRN:  [id number]   Chief Complaint: Headache (Hit head during a fall on 11/03/19- did not go to ER for eval. Then had an incident of breaking fall 2 weeks ago and arm has been hurting up into neck.)  Headache  This is a chronic problem. Episode onset: worse since 11/02/20. The problem occurs daily (usually evening). The problem has been gradually worsening. The pain is located in the occipital and right unilateral region. The pain does not radiate. The quality of the pain is described as throbbing. The pain is at a severity of 7/10. The pain is moderate. Associated symptoms include eye watering, neck pain and rhinorrhea. Pertinent negatives include no abdominal pain, back pain, blurred vision, coughing, dizziness, ear pain, eye pain, eye redness, fever, hearing loss, loss of balance, nausea, numbness, phonophobia, photophobia, scalp tenderness, seizures, sinus pressure, sore throat, tingling, visual change, vomiting or weakness. Exacerbated by: tylenol usually in evening. She has tried acetaminophen for the symptoms. The treatment provided mild relief. Her past medical history is significant for recent head traumas. There is no history of cancer.  Arm Injury  The incident occurred more than 1 week ago (2 weeks ago). The pain is present in the upper right arm. The quality of the pain is described as aching. The pain does not radiate. The pain is moderate. Pertinent negatives include no numbness or tingling. She has tried acetaminophen for the symptoms.    Lab Results  Component Value Date   CREATININE 0.87 11/27/2014   BUN 8 11/27/2014   NA 141 11/27/2014   K 4.9 11/27/2014   CL 107 11/27/2014   CO2 27 11/27/2014   No results found for: CHOL, HDL, LDLCALC, LDLDIRECT, TRIG, CHOLHDL No results found for: TSH No results found for: HGBA1C Lab Results  Component Value Date   WBC 7.9 11/27/2014   HGB 13.4 11/27/2014   HCT  40.7 11/27/2014   MCV 82.3 11/27/2014   PLT 226 11/27/2014   Lab Results  Component Value Date   ALT 18 11/27/2014   AST 27 11/27/2014   ALKPHOS 80 11/27/2014   BILITOT 0.3 11/27/2014     Review of Systems  Constitutional: Negative.  Negative for chills, fatigue, fever and unexpected weight change.  HENT: Positive for rhinorrhea. Negative for congestion, ear discharge, ear pain, hearing loss, sinus pressure, sneezing and sore throat.   Eyes: Negative for blurred vision, double vision, photophobia, pain, discharge, redness and itching.  Respiratory: Negative for cough, shortness of breath, wheezing and stridor.   Gastrointestinal: Negative for abdominal pain, blood in stool, constipation, diarrhea, nausea and vomiting.  Endocrine: Negative for cold intolerance, heat intolerance, polydipsia, polyphagia and polyuria.  Genitourinary: Negative for dysuria, flank pain, frequency, hematuria, menstrual problem, pelvic pain, urgency, vaginal bleeding and vaginal discharge.  Musculoskeletal: Positive for neck pain. Negative for arthralgias, back pain and myalgias.  Skin: Negative for rash.  Allergic/Immunologic: Negative for environmental allergies and food allergies.  Neurological: Positive for headaches. Negative for dizziness, tingling, seizures, weakness, light-headedness, numbness and loss of balance.  Hematological: Negative for adenopathy. Does not bruise/bleed easily.  Psychiatric/Behavioral: Negative for dysphoric mood. The patient is not nervous/anxious.     There are no problems to display for this patient.   Allergies  Allergen Reactions  . Morphine And Related Nausea Only    Past Surgical History:  Procedure Laterality Date  . COLONOSCOPY  2006?  Dr Maryruth Bun  . VAGINAL HYSTERECTOMY      Social History   Tobacco Use  . Smoking status: Current Every Day Smoker    Types: E-cigarettes  . Smokeless tobacco: Never Used  Substance Use Topics  . Alcohol use: No     Alcohol/week: 0.0 standard drinks  . Drug use: No     Medication list has been reviewed and updated.  No outpatient medications have been marked as taking for the 01/01/20 encounter (Office Visit) with Duanne Limerick, MD.    [facility] 2/9 Scores 01/01/2020 03/28/2017  PHQ - 2 Score 0 2  PHQ- 9 Score 0 12    GAD 7 : Generalized Anxiety Score 01/01/2020  Nervous, Anxious, on Edge 0  Control/stop worrying 0  Worry too much - different things 0  Trouble relaxing 0  Restless 0  Easily annoyed or irritable 0  Afraid - awful might happen 0  Total GAD 7 Score 0    BP Readings from Last 3 Encounters:  01/01/20 (!) 130/100  11/11/17 110/60  03/28/17 120/80    Physical Exam Vitals and nursing note reviewed.  Constitutional:      General: She is not in acute distress.    Appearance: She is not diaphoretic.  HENT:     Head: Normocephalic and atraumatic.     Right Ear: External ear normal.     Left Ear: External ear normal.     Nose: Nose normal.     Mouth/Throat:     Mouth: Oropharynx is clear and moist.  Eyes:     General:        Right eye: No discharge.        Left eye: No discharge.     Extraocular Movements: EOM normal.     Conjunctiva/sclera: Conjunctivae normal.     Pupils: Pupils are equal, round, and reactive to light.  Neck:     Thyroid: No thyromegaly.     Vascular: No JVD.  Cardiovascular:     Rate and Rhythm: Normal rate and regular rhythm.     Pulses: Intact distal pulses.  Pulmonary:     Effort: Pulmonary effort is normal.     Breath sounds: Normal breath sounds.  Abdominal:     General: Bowel sounds are normal.     Palpations: Abdomen is soft. There is no mass.     Tenderness: There is no abdominal tenderness. There is no guarding.  Musculoskeletal:        General: No edema. Normal range of motion.     Right upper arm: Tenderness present.     Cervical back: Normal range of motion and neck supple. Spasms and tenderness present. No rigidity or torticollis.  Pain with movement, spinous process tenderness and muscular tenderness present.     Comments: Tender midhumerus  Lymphadenopathy:     Cervical: No cervical adenopathy.  Skin:    General: Skin is warm and dry.  Neurological:     Mental Status: She is alert.     Cranial Nerves: Cranial nerves are intact. No cranial nerve deficit, dysarthria or facial asymmetry.     Sensory: Sensation is intact.     Motor: Motor function is intact. No weakness.     Deep Tendon Reflexes: Reflexes are normal and symmetric.     Wt Readings from Last 3 Encounters:  01/01/20 173 lb (78.5 kg)  11/11/17 163 lb (73.9 kg)  03/28/17 171 lb (77.6 kg)    BP (!) 130/100   Pulse 80  Ht 5\' 7"  (1.702 m)   Wt 173 lb (78.5 kg)   BMI 27.10 kg/m   Assessment and Plan:  1. Chronic right-sided headaches                       chronic with newly increased intensity of headaches since recent fall.  Patient has no localized motor weakness or sensory deficit.  Because of the persistent nature of the headaches and the change in the intensity we will refer to neurology for evaluation.  Patient's been instructed should any other concerns develop that she is to go to the emergency room. - Ambulatory referral to Neurology  2. Neck pain New onset.  Persistent.  Patient has had a previous C-spine that was done and there is been noted to be severe multilevel cervical degenerative changes which appear slightly worse compared to the 2010 exam.  Patient will be informed of this and patient may take Advil or Aleve for the current pain.  Patient is now taking acetaminophen.  He may continue this as well - DG Cervical Spine Complete; Future  3. Right arm pain Status post incident 2 weeks ago involving her falling back on her arm.  We will x-ray given that she has tenderness but no palpable defect of the right humerus. - DG Humerus Right; Future  4. Elevated blood pressure reading Patient has elevated blood pressure reading in the  range of 132/98.  Patient's been given a Dash diet and is to return for recheck of blood pressure.  5. Need for immunization against influenza Discussion and administration. - Flu Vaccine QUAD High Dose(Fluad)

## 2020-01-01 NOTE — Patient Instructions (Signed)
DASH Eating Plan DASH stands for "Dietary Approaches to Stop Hypertension." The DASH eating plan is a healthy eating plan that has been shown to reduce high blood pressure (hypertension). It may also reduce your risk for type 2 diabetes, heart disease, and stroke. The DASH eating plan may also help with weight loss. What are tips for following this plan?  General guidelines  Avoid eating more than 2,300 mg (milligrams) of salt (sodium) a day. If you have hypertension, you may need to reduce your sodium intake to 1,500 mg a day.  Limit alcohol intake to no more than 1 drink a day for nonpregnant women and 2 drinks a day for men. One drink equals 12 oz of beer, 5 oz of wine, or 1 oz of hard liquor.  Work with your health care provider to maintain a healthy body weight or to lose weight. Ask what an ideal weight is for you.  Get at least 30 minutes of exercise that causes your heart to beat faster (aerobic exercise) most days of the week. Activities may include walking, swimming, or biking.  Work with your health care provider or diet and nutrition specialist (dietitian) to adjust your eating plan to your individual calorie needs. Reading food labels   Check food labels for the amount of sodium per serving. Choose foods with less than 5 percent of the Daily Value of sodium. Generally, foods with less than 300 mg of sodium per serving fit into this eating plan.  To find whole grains, look for the word "whole" as the first word in the ingredient list. Shopping  Buy products labeled as "low-sodium" or "no salt added."  Buy fresh foods. Avoid canned foods and premade or frozen meals. Cooking  Avoid adding salt when cooking. Use salt-free seasonings or herbs instead of table salt or sea salt. Check with your health care provider or pharmacist before using salt substitutes.  Do not fry foods. Cook foods using healthy methods such as baking, boiling, grilling, and broiling instead.  Cook with  heart-healthy oils, such as olive, canola, soybean, or sunflower oil. Meal planning  Eat a balanced diet that includes: ? 5 or more servings of fruits and vegetables each day. At each meal, try to fill half of your plate with fruits and vegetables. ? Up to 6-8 servings of whole grains each day. ? Less than 6 oz of lean meat, poultry, or fish each day. A 3-oz serving of meat is about the same size as a deck of cards. One egg equals 1 oz. ? 2 servings of low-fat dairy each day. ? A serving of nuts, seeds, or beans 5 times each week. ? Heart-healthy fats. Healthy fats called Omega-3 fatty acids are found in foods such as flaxseeds and coldwater fish, like sardines, salmon, and mackerel.  Limit how much you eat of the following: ? Canned or prepackaged foods. ? Food that is high in trans fat, such as fried foods. ? Food that is high in saturated fat, such as fatty meat. ? Sweets, desserts, sugary drinks, and other foods with added sugar. ? Full-fat dairy products.  Do not salt foods before eating.  Try to eat at least 2 vegetarian meals each week.  Eat more home-cooked food and less restaurant, buffet, and fast food.  When eating at a restaurant, ask that your food be prepared with less salt or no salt, if possible. What foods are recommended? The items listed may not be a complete list. Talk with your dietitian about   what dietary choices are best for you. Grains Whole-grain or whole-wheat bread. Whole-grain or whole-wheat pasta. Brown rice. Oatmeal. Quinoa. Bulgur. Whole-grain and low-sodium cereals. Pita bread. Low-fat, low-sodium crackers. Whole-wheat flour tortillas. Vegetables Fresh or frozen vegetables (raw, steamed, roasted, or grilled). Low-sodium or reduced-sodium tomato and vegetable juice. Low-sodium or reduced-sodium tomato sauce and tomato paste. Low-sodium or reduced-sodium canned vegetables. Fruits All fresh, dried, or frozen fruit. Canned fruit in natural juice (without  added sugar). Meat and other protein foods Skinless chicken or turkey. Ground chicken or turkey. Pork with fat trimmed off. Fish and seafood. Egg whites. Dried beans, peas, or lentils. Unsalted nuts, nut butters, and seeds. Unsalted canned beans. Lean cuts of beef with fat trimmed off. Low-sodium, lean deli meat. Dairy Low-fat (1%) or fat-free (skim) milk. Fat-free, low-fat, or reduced-fat cheeses. Nonfat, low-sodium ricotta or cottage cheese. Low-fat or nonfat yogurt. Low-fat, low-sodium cheese. Fats and oils Soft margarine without trans fats. Vegetable oil. Low-fat, reduced-fat, or light mayonnaise and salad dressings (reduced-sodium). Canola, safflower, olive, soybean, and sunflower oils. Avocado. Seasoning and other foods Herbs. Spices. Seasoning mixes without salt. Unsalted popcorn and pretzels. Fat-free sweets. What foods are not recommended? The items listed may not be a complete list. Talk with your dietitian about what dietary choices are best for you. Grains Baked goods made with fat, such as croissants, muffins, or some breads. Dry pasta or rice meal packs. Vegetables Creamed or fried vegetables. Vegetables in a cheese sauce. Regular canned vegetables (not low-sodium or reduced-sodium). Regular canned tomato sauce and paste (not low-sodium or reduced-sodium). Regular tomato and vegetable juice (not low-sodium or reduced-sodium). Pickles. Olives. Fruits Canned fruit in a light or heavy syrup. Fried fruit. Fruit in cream or butter sauce. Meat and other protein foods Fatty cuts of meat. Ribs. Fried meat. Bacon. Sausage. Bologna and other processed lunch meats. Salami. Fatback. Hotdogs. Bratwurst. Salted nuts and seeds. Canned beans with added salt. Canned or smoked fish. Whole eggs or egg yolks. Chicken or turkey with skin. Dairy Whole or 2% milk, cream, and half-and-half. Whole or full-fat cream cheese. Whole-fat or sweetened yogurt. Full-fat cheese. Nondairy creamers. Whipped toppings.  Processed cheese and cheese spreads. Fats and oils Butter. Stick margarine. Lard. Shortening. Ghee. Bacon fat. Tropical oils, such as coconut, palm kernel, or palm oil. Seasoning and other foods Salted popcorn and pretzels. Onion salt, garlic salt, seasoned salt, table salt, and sea salt. Worcestershire sauce. Tartar sauce. Barbecue sauce. Teriyaki sauce. Soy sauce, including reduced-sodium. Steak sauce. Canned and packaged gravies. Fish sauce. Oyster sauce. Cocktail sauce. Horseradish that you find on the shelf. Ketchup. Mustard. Meat flavorings and tenderizers. Bouillon cubes. Hot sauce and Tabasco sauce. Premade or packaged marinades. Premade or packaged taco seasonings. Relishes. Regular salad dressings. Where to find more information:  National Heart, Lung, and Blood Institute: www.nhlbi.nih.gov  American Heart Association: www.heart.org Summary  The DASH eating plan is a healthy eating plan that has been shown to reduce high blood pressure (hypertension). It may also reduce your risk for type 2 diabetes, heart disease, and stroke.  With the DASH eating plan, you should limit salt (sodium) intake to 2,300 mg a day. If you have hypertension, you may need to reduce your sodium intake to 1,500 mg a day.  When on the DASH eating plan, aim to eat more fresh fruits and vegetables, whole grains, lean proteins, low-fat dairy, and heart-healthy fats.  Work with your health care provider or diet and nutrition specialist (dietitian) to adjust your eating plan to your   individual calorie needs. This information is not intended to replace advice given to you by your health care provider. Make sure you discuss any questions you have with your health care provider. Document Revised: 12/03/2016 Document Reviewed: 12/15/2015 Elsevier Patient Education  2020 Elsevier Inc.  

## 2020-01-02 ENCOUNTER — Encounter: Payer: Self-pay | Admitting: Family Medicine

## 2020-01-24 ENCOUNTER — Telehealth: Payer: Self-pay

## 2020-01-24 NOTE — Telephone Encounter (Unsigned)
Copied from CRM [id number]. Topic: General - Other >> Jan 24, 2020 10:34 AM Lyn Hollingshead D wrote: PT need to speak with Delice Bison about a billing issues / please advise

## 2020-01-24 NOTE — Telephone Encounter (Signed)
Says she has billing issues?

## 2020-01-29 ENCOUNTER — Ambulatory Visit: Payer: Medicare Other | Admitting: Family Medicine

## 2020-03-20 ENCOUNTER — Telehealth: Payer: Self-pay

## 2020-03-20 NOTE — Telephone Encounter (Unsigned)
Copied from CRM [id number]. Topic: General - Call Back - No Documentation >> Mar 20, 2020 11:57 AM Aretta Nip wrote: Pt was in a month or so ago with arm pain and now arm is still hurting (right) a really bad ache, it is not any better. wanting Delice Bison to advise what do next.  Already been thru the x-Rays... [id number] [phone].

## 2020-03-20 NOTE — Telephone Encounter (Signed)
Call her and see if she wants to see Dr Ashley Royalty for her arm. If she is having headaches- she needs to see Dr Malvin Johns who she is established with already.

## 2020-03-26 NOTE — Telephone Encounter (Signed)
Gave pt info, stated she did not want to see providers mentioned

## 2020-05-01 DIAGNOSIS — Z23 Encounter for immunization: Secondary | ICD-10-CM | POA: Diagnosis not present

## 2020-09-19 ENCOUNTER — Telehealth: Payer: Self-pay | Admitting: *Deleted

## 2020-09-19 NOTE — Telephone Encounter (Signed)
LMOM for patient to call office to schedule her AWV. Office number provided on voicemail 

## 2021-01-29 ENCOUNTER — Ambulatory Visit
Admission: RE | Admit: 2021-01-29 | Discharge: 2021-01-29 | Disposition: A | Discharge status: Home / Self Care | Payer: Medicare Other | Source: Ambulatory Visit | Attending: Internal Medicine | Admitting: Internal Medicine

## 2021-01-29 ENCOUNTER — Other Ambulatory Visit: Payer: Self-pay

## 2021-01-29 ENCOUNTER — Ambulatory Visit: Payer: Self-pay | Admitting: *Deleted

## 2021-01-29 VITALS — BP 133/99 | HR 91 | Temp 98.5°F | Resp 19

## 2021-01-29 DIAGNOSIS — J029 Acute pharyngitis, unspecified: Secondary | ICD-10-CM | POA: Insufficient documentation

## 2021-01-29 DIAGNOSIS — Z20822 Contact with and (suspected) exposure to covid-19: Secondary | ICD-10-CM | POA: Diagnosis not present

## 2021-01-29 DIAGNOSIS — F17290 Nicotine dependence, other tobacco product, uncomplicated: Secondary | ICD-10-CM | POA: Insufficient documentation

## 2021-01-29 LAB — GROUP A STREP BY PCR: Group A Strep by PCR: NOT DETECTED

## 2021-01-29 NOTE — Discharge Instructions (Signed)
Your symptoms today are most likely the beginning of a virus and should steadily improve in time it can take up to 7 to 10 days before you truly start to see a turnaround however things will get better  Strep test negative  COVID test pending, you will be called if positive    You can take Tylenol and/or Ibuprofen as needed for fever reduction and pain relief.   For cough: honey 1/2 to 1 teaspoon (you can dilute the honey in water or another fluid).  You can also use guaifenesin and dextromethorphan for cough. You can use a humidifier for chest congestion and cough.  If you don't have a humidifier, you can sit in the bathroom with the hot shower running.      For sore throat: try warm salt water gargles, cepacol lozenges, throat spray, warm tea or water with lemon/honey, popsicles or ice, or OTC cold relief medicine for throat discomfort.   For congestion: take a daily anti-histamine like Zyrtec, Claritin, and a oral decongestant, such as pseudoephedrine.  You can also use Flonase 1-2 sprays in each nostril daily.   It is important to stay hydrated: drink plenty of fluids (water, gatorade/powerade/pedialyte, juices, or teas) to keep your throat moisturized and help further relieve irritation/discomfort.

## 2021-01-29 NOTE — ED Provider Notes (Signed)
[facility]    CSN: [id number] Arrival date & time: 01/29/21  1145      History   Chief Complaint Chief Complaint  Patient presents with   Sore Throat    HPI Taylor Marshall is a 72 y.o. female.   Patient presents with sore throat, heaviness in the head, left-sided ear pain and fatigue beginning this morning upon awakening.  Painful to swallow.  Pain radiates down the left side of neck into the upper back.  Unable to tolerate food but tolerating liquids.  No known sick contacts.  No pertinent medical history.  Daily smoker.  History reviewed. No pertinent past medical history.  There are no problems to display for this patient.   Past Surgical History:  Procedure Laterality Date   COLONOSCOPY  2006?   Dr Maryruth Bun   VAGINAL HYSTERECTOMY      OB History   No obstetric history on file.      Home Medications    Prior to Admission medications   Not on File    Family History Family History  Problem Relation Age of Onset   Cancer Mother    Hypertension Mother    Diabetes Mother    Hypertension Father    Heart failure Father    Heart disease Father    Atrial fibrillation Father     Social History Social History   Tobacco Use   Smoking status: Every Day    Types: E-cigarettes   Smokeless tobacco: Never  Substance Use Topics   Alcohol use: No    Alcohol/week: 0.0 standard drinks   Drug use: No     Allergies   Morphine and related   Review of Systems Review of Systems  Constitutional:  Positive for fatigue. Negative for activity change, appetite change, chills, diaphoresis, fever and unexpected weight change.  HENT:  Positive for ear pain and sore throat. Negative for congestion, dental problem, drooling, ear discharge, facial swelling, hearing loss, mouth sores, nosebleeds, postnasal drip, rhinorrhea, sinus pressure, sinus pain, sneezing, tinnitus, trouble swallowing and voice change.   Respiratory: Negative.    Gastrointestinal:  Negative.   Skin: Negative.   Neurological: Negative.     Physical Exam Triage Vital Signs ED Triage Vitals  Enc Vitals Group     BP 01/29/21 1201 (!) 133/99     Pulse Rate 01/29/21 1201 91     Resp 01/29/21 1201 19     Temp 01/29/21 1201 98.5 F (36.9 C)     Temp src --      SpO2 01/29/21 1201 98 %     Weight --      Height --      Head Circumference --      Peak Flow --      Pain Score 01/29/21 1157 6     Pain Loc --      Pain Edu? --      Excl. in GC? --    No data found.  Updated Vital Signs BP (!) 133/99    Pulse 91    Temp 98.5 F (36.9 C)    Resp 19    SpO2 98%   Visual Acuity Right Eye Distance:   Left Eye Distance:   Bilateral Distance:    Right Eye Near:   Left Eye Near:    Bilateral Near:     Physical Exam Constitutional:      Appearance: She is well-developed.  HENT:     Head: Normocephalic.  Right Ear: Tympanic membrane and ear canal normal.     Left Ear: Tympanic membrane and ear canal normal.     Nose: No congestion or rhinorrhea.     Mouth/Throat:     Mouth: Mucous membranes are moist.     Pharynx: Posterior oropharyngeal erythema present.     Tonsils: No tonsillar exudate. 0 on the right. 0 on the left.  Cardiovascular:     Rate and Rhythm: Normal rate and regular rhythm.     Heart sounds: Normal heart sounds.  Pulmonary:     Effort: Pulmonary effort is normal.     Breath sounds: Normal breath sounds.  Musculoskeletal:     Cervical back: Normal range of motion and neck supple.  Skin:    General: Skin is warm and dry.  Neurological:     General: No focal deficit present.     Mental Status: She is alert and oriented to person, place, and time.  Psychiatric:        Mood and Affect: Mood normal.        Behavior: Behavior normal.     UC Treatments / Results  Labs (all labs ordered are listed, but only abnormal results are displayed) Labs Reviewed  SARS CORONAVIRUS 2 (TAT 6-24 HRS)    EKG   Radiology No results  found.  Procedures Procedures (including critical care time)  Medications Ordered in UC Medications - No data to display  Initial Impression / Assessment and Plan / UC Course  I have reviewed the triage vital signs and the nursing notes.  Pertinent labs & imaging results that were available during my care of the patient were reviewed by me and considered in my medical decision making (see chart for details).  Sore throat  Strep PCR negative, COVID test pending, discussed findings with patient, may use over-the-counter medication for symptom management can attempt salt water gargles, Listerine gargles, warm liquids, throat lozenges, increase fluid intake and Tylenol or ibuprofen to help manage symptoms, urgent care follow-up as needed Final Clinical Impressions(s) / UC Diagnoses   Final diagnoses:  None   Discharge Instructions   None    ED Prescriptions   None    PDMP not reviewed this encounter.   Valinda Hoar, NP 01/29/21 1254

## 2021-01-29 NOTE — ED Triage Notes (Signed)
Pt presents with complaints of sore throat, head heaviness, and fatigue that started this morning. The pain is worse when swallowing.

## 2021-01-29 NOTE — Telephone Encounter (Signed)
Summary: pt stabbing pain in throat to swallow, tired, office states offer UC   Pt wants a fu as she has a very bad sore thoat, can not swallow and stabbing pain when she does,very tired and ears are hurting. Called office, Dr Yetta Barre had to leave and they had to move all appt, was offered an appt at 8 in the a.m. but refused. Pt would like NT call instead. Office stated to offer UC which referring that to NT  fu [phone]      Reason for Disposition  SEVERE (e.g., excruciating) throat pain  Answer Assessment - Initial Assessment Questions 1. ONSET: "When did the throat start hurting?" (Hours or days ago)      3 am this morning 2. SEVERITY: "How bad is the sore throat?" (Scale 1-10; mild, moderate or severe)   - MILD (1-3):  doesn't interfere with eating or normal activities   - MODERATE (4-7): interferes with eating some solids and normal activities   - SEVERE (8-10):  excruciating pain, interferes with most normal activities   - SEVERE DYSPHAGIA: can't swallow liquids, drooling     Stabbing pain- severe 3. STREP EXPOSURE: "Has there been any exposure to strep within the past week?" If Yes, ask: "What type of contact occurred?"      unknown 4.  VIRAL SYMPTOMS: "Are there any symptoms of a cold, such as a runny nose, cough, hoarse voice or red eyes?"      Ear pain 5. FEVER: "Do you have a fever?" If Yes, ask: "What is your temperature, how was it measured, and when did it start?"     Chill earlier- not checked 6. PUS ON THE TONSILS: "Is there pus on the tonsils in the back of your throat?"     Has not looked 7. OTHER SYMPTOMS: "Do you have any other symptoms?" (e.g., difficulty breathing, headache, rash)     Head pressure 8. PREGNANCY: "Is there any chance you are pregnant?" "When was your last menstrual period?"     *No Answer*  Protocols used: Sore Throat-A-AH

## 2021-01-29 NOTE — Telephone Encounter (Signed)
Note   Pt advised to go to UC.  KP

## 2021-01-29 NOTE — Telephone Encounter (Signed)
°  Chief Complaint: sore throat Symptoms: ear pain, sore throat, head pressure Frequency: woke early this morning with symptoms Pertinent Negatives: Patient denies congestion, other sinus symptoms Disposition: [] ED /[x] Urgent Care (no appt availability in office) / [] Appointment(In office/virtual)/ []  [facility]/ [] Home Care/ [] Refused Recommended Disposition /[] [facility]/ []  Follow-up with PCP Additional Notes:

## 2021-01-30 LAB — SARS CORONAVIRUS 2 (TAT 6-24 HRS): SARS Coronavirus 2: NEGATIVE

## 2021-02-05 ENCOUNTER — Encounter: Payer: Self-pay | Admitting: Family Medicine

## 2021-02-05 ENCOUNTER — Other Ambulatory Visit: Payer: Self-pay

## 2021-02-05 ENCOUNTER — Ambulatory Visit (INDEPENDENT_AMBULATORY_CARE_PROVIDER_SITE_OTHER): Payer: Medicare Other | Admitting: Family Medicine

## 2021-02-05 VITALS — BP 128/84 | HR 76 | Ht 67.0 in | Wt 182.0 lb

## 2021-02-05 DIAGNOSIS — K115 Sialolithiasis: Secondary | ICD-10-CM

## 2021-02-05 DIAGNOSIS — K625 Hemorrhage of anus and rectum: Secondary | ICD-10-CM

## 2021-02-05 MED ORDER — BENZOCAINE 10 % MT GEL: 1.0000 "application " | OROMUCOSAL | 0 refills | Status: DC | PRN

## 2021-02-05 NOTE — Patient Instructions (Signed)
Salivary Stone  A salivary stone is a mineral deposit that builds up in the ducts that drain your salivary glands. Most salivary stones are made of calcium. When a stoneforms, saliva can back up into the gland and cause painful swelling. Your salivary glands are the glands that produce saliva. You have six major salivary glands. Each gland has a duct that carries saliva into your mouth. Saliva keeps your mouth moist and breaks down the food that you eat. It alsohelps prevent tooth decay. Two salivary glands are located just in front of your ears (parotid). The ducts for these glands open up inside your cheeks, near your back teeth. You also have two glands under your tongue (sublingual) and two glands under your jaw (submandibular). The ducts for these glands open under your tongue. A stone can form in any salivary gland. The most common place for a salivary stone to develop is in asubmandibular salivary gland. What are the causes? Salivary stones may be caused by any condition that reduces the flow of saliva.It is not known why some people form stones. What increases the risk? You are more likely to develop this condition if: You are female. You do not drink enough water. You smoke. You have any of the following: High blood pressure. Gout. Diabetes. What are the signs or symptoms? The main sign of a salivary gland stone is sudden swelling of a salivary gland when eating. This usually happens under the jaw on one side. Other signs and symptoms may include: Swelling of the cheek or under the tongue when eating. Pain in the swollen area. Trouble chewing or swallowing. Swelling that goes down after eating. How is this diagnosed? This condition may be diagnosed based on: Your signs and symptoms. A physical exam. In many cases, your health care provider will be able to feel the stone in a duct inside your mouth. Imaging studies, such as: X-rays. Ultrasound. CT scan. MRI. You may need to see  an ear, nose, and throat specialist (ENT or otolaryngologist) for diagnosis and treatment. How is this treated? Treatment for this condition depends on the size of the stone. A small stone that is not causing symptoms may be treated with home care. For a stone that is large enough to cause symptoms, the treatment options may include: Probing and widening of the duct to allow the stone to pass. Inserting a thin, flexible scope (endoscope) into the duct to locate and remove the stone. Breaking up the stone with sound waves. Removing the entire salivary gland. Follow these instructions at home:  To relieve discomfort Follow these instructions every few hours: Suck on a lemon candy to stimulate the flow of saliva. Put a warm compress over the gland. Gently massage the gland. General instructions Drink enough fluid to keep your urine pale yellow. Do not use any products that contain nicotine or tobacco, such as cigarettes and e-cigarettes. If you need help quitting, ask your health care provider. Keep all follow-up visits as told by your health care provider. This is important. Contact a health care provider if: You have pain and swelling in your face, jaw, or mouth after eating. You have persistent swelling in any of these places: In front of your ear. Under your jaw. Inside your mouth. Get help right away if: You have pain and swelling in your face, jaw, or mouth, and this is getting worse. Your pain and swelling make it hard to swallow or breathe. Summary A salivary stone is a mineral deposit that   builds up in the ducts that drain your salivary glands. When a stone forms, saliva can back up into the gland and cause painful swelling. Salivary stones may be caused by any condition that reduces the flow of saliva. Treatment for this condition depends on the size of the stone. This information is not intended to replace advice given to you by your health care provider. Make sure you  discuss any questions you have with your healthcare provider. Document Revised: 01/31/2017 Document Reviewed: 01/31/2017 Elsevier Patient Education  2022 Elsevier Inc.  

## 2021-02-05 NOTE — Progress Notes (Signed)
Date:  02/05/2021   Name:  Taylor Marshall   DOB:  1950-07-11   MRN:  [id number]   Chief Complaint: Blood In Stools and place under tongue (Felt it- was hurting, but not now- been there approx 1 week)  Rectal Bleeding  The current episode started yesterday. The onset was sudden. The problem occurs occasionally. Progression since onset: one episode. The patient is experiencing no pain. The stool is described as soft. Associated symptoms include nausea and vomiting. Pertinent negatives include no fever, no abdominal pain, no diarrhea, no hematemesis, no hemorrhoids, no rectal pain, no hematuria, no vaginal bleeding, no vaginal discharge, no chest pain, no headaches, no coughing and no rash.  Emesis  This is a chronic problem. The current episode started more than 1 year ago. Episode frequency: at least 1/wk. There has been no fever. Pertinent negatives include no abdominal pain, chest pain, chills, coughing, diarrhea, dizziness, fever, headaches, myalgias or weight loss.  Mouth Lesions  The current episode started 3 to 5 days ago. The onset was sudden. The problem has been unchanged. The problem is mild (soreness). Associated symptoms include nausea, vomiting and mouth sores. Pertinent negatives include no fever, no abdominal pain, no constipation, no diarrhea, no ear discharge, no ear pain, no headaches, no sore throat, no neck pain, no cough, no wheezing and no rash.   Lab Results  Component Value Date   NA 141 11/27/2014   K 4.9 11/27/2014   CO2 27 11/27/2014   GLUCOSE 104 (H) 11/27/2014   BUN 8 11/27/2014   CREATININE 0.87 11/27/2014   CALCIUM 10.1 11/27/2014   GFRNONAA >60 11/27/2014   No results found for: CHOL, HDL, LDLCALC, LDLDIRECT, TRIG, CHOLHDL No results found for: TSH No results found for: HGBA1C Lab Results  Component Value Date   WBC 7.9 11/27/2014   HGB 13.4 11/27/2014   HCT 40.7 11/27/2014   MCV 82.3 11/27/2014   PLT 226 11/27/2014   Lab Results  Component  Value Date   ALT 18 11/27/2014   AST 27 11/27/2014   ALKPHOS 80 11/27/2014   BILITOT 0.3 11/27/2014   No results found for: 25OHVITD2, 25OHVITD3, VD25OH   Review of Systems  Constitutional:  Negative for chills, fever and weight loss.  HENT:  Positive for mouth sores. Negative for drooling, ear discharge, ear pain and sore throat.   Respiratory:  Negative for cough, shortness of breath and wheezing.   Cardiovascular:  Negative for chest pain, palpitations and leg swelling.  Gastrointestinal:  Positive for hematochezia, nausea and vomiting. Negative for abdominal pain, blood in stool, constipation, diarrhea, hematemesis, hemorrhoids and rectal pain.  Endocrine: Negative for polydipsia.  Genitourinary:  Negative for dysuria, frequency, hematuria, urgency, vaginal bleeding and vaginal discharge.  Musculoskeletal:  Negative for back pain, myalgias and neck pain.  Skin:  Negative for rash.  Allergic/Immunologic: Negative for environmental allergies.  Neurological:  Negative for dizziness and headaches.  Hematological:  Does not bruise/bleed easily.  Psychiatric/Behavioral:  Negative for suicidal ideas. The patient is not nervous/anxious.    There are no problems to display for this patient.   Allergies  Allergen Reactions   Morphine And Related Nausea Only    Past Surgical History:  Procedure Laterality Date   COLONOSCOPY  2006?   Dr Maryruth Bun   VAGINAL HYSTERECTOMY      Social History   Tobacco Use   Smoking status: Every Day    Types: E-cigarettes   Smokeless tobacco: Never  Substance Use Topics  Alcohol use: No    Alcohol/week: 0.0 standard drinks   Drug use: No     Medication list has been reviewed and updated.  No outpatient medications have been marked as taking for the 02/05/21 encounter (Office Visit) with Duanne Limerick, MD.    [facility] 2/9 Scores 02/05/2021 01/01/2020 03/28/2017  PHQ - 2 Score 4 0 2  PHQ- 9 Score 4 0 12    GAD 7 : Generalized Anxiety Score  02/05/2021 01/01/2020  Nervous, Anxious, on Edge 2 0  Control/stop worrying 2 0  Worry too much - different things 3 0  Trouble relaxing 0 0  Restless 0 0  Easily annoyed or irritable 0 0  Afraid - awful might happen 2 0  Total GAD 7 Score 9 0  Anxiety Difficulty Somewhat difficult -    BP Readings from Last 3 Encounters:  02/05/21 128/84  01/29/21 (!) 133/99  01/01/20 (!) 132/98    Physical Exam Vitals and nursing note reviewed.  Constitutional:      Appearance: She is well-developed.  HENT:     Head: Normocephalic.     Right Ear: Tympanic membrane and external ear normal. There is no impacted cerumen.     Left Ear: Tympanic membrane and external ear normal. There is no impacted cerumen.     Nose: Nose normal.  Eyes:     General: Lids are everted, no foreign bodies appreciated. No scleral icterus.       Left eye: No foreign body or hordeolum.     Conjunctiva/sclera: Conjunctivae normal.     Right eye: Right conjunctiva is not injected.     Left eye: Left conjunctiva is not injected.     Pupils: Pupils are equal, round, and reactive to light.  Neck:     Thyroid: No thyromegaly.     Vascular: No JVD.     Trachea: No tracheal deviation.  Cardiovascular:     Rate and Rhythm: Normal rate and regular rhythm.     Heart sounds: Normal heart sounds. No murmur heard.   No friction rub. No gallop.  Pulmonary:     Effort: Pulmonary effort is normal. No respiratory distress.     Breath sounds: Normal breath sounds. No wheezing, rhonchi or rales.  Abdominal:     General: Bowel sounds are normal.     Palpations: Abdomen is soft. There is no mass.     Tenderness: There is no abdominal tenderness. There is no guarding or rebound.  Musculoskeletal:        General: No tenderness. Normal range of motion.     Cervical back: Normal range of motion and neck supple.  Lymphadenopathy:     Cervical: No cervical adenopathy.  Skin:    General: Skin is warm.     Findings: No rash.   Neurological:     Mental Status: She is alert and oriented to person, place, and time.     Cranial Nerves: No cranial nerve deficit.     Deep Tendon Reflexes: Reflexes normal.  Psychiatric:        Mood and Affect: Mood is not anxious or depressed.    Wt Readings from Last 3 Encounters:  02/05/21 182 lb (82.6 kg)  01/01/20 173 lb (78.5 kg)  11/11/17 163 lb (73.9 kg)    BP 128/84    Pulse 76    Ht 5\' 7"  (1.702 m)    Wt 182 lb (82.6 kg)    BMI 28.51 kg/m   Assessment and  Plan:  1. Rectal bleed New onset.  1 episode.  Bright red blood per rectum was noted by patient when she cleaned herself.  This is consistent with a lower GI bleed.  Patient had a GI/colonoscopy done by Dr. Nicolasa Ducking in 06.  We will obtain CBC to see if there has been any decrease of hemoglobin and refer to GI for evaluation. - Ambulatory referral to Gastroenterology - CBC with Differential/Platelet  2. Salivary stone Erythematous swelling of the right salivary duct sublingual.  No stone is observed at this time and I suspect there is been a general removal or it may have been within the duct still.  Patient has been given some Orajel and has suggested that she use sour candy to increase salivation.  If this does not resolve the situation patient is to contact us and we will refer to oral surgery or ENT. - benzocaine (ORAJEL) 10 % mucosal gel; Use as directed 1 application in the mouth or throat as needed for mouth pain.  Dispense: 5.3 g; Refill: 0 n.

## 2021-02-06 LAB — CBC WITH DIFFERENTIAL/PLATELET
Basophils Absolute: 0.1 10*3/uL (ref 0.0–0.2)
Basos: 1 %
EOS (ABSOLUTE): 0.1 10*3/uL (ref 0.0–0.4)
Eos: 1 %
Hematocrit: 44 % (ref 34.0–46.6)
Hemoglobin: 14.4 g/dL (ref 11.1–15.9)
Immature Grans (Abs): 0 10*3/uL (ref 0.0–0.1)
Immature Granulocytes: 0 %
Lymphocytes Absolute: 3.3 10*3/uL — ABNORMAL HIGH (ref 0.7–3.1)
Lymphs: 39 %
MCH: 26.6 pg (ref 26.6–33.0)
MCHC: 32.7 g/dL (ref 31.5–35.7)
MCV: 81 fL (ref 79–97)
Monocytes Absolute: 0.5 10*3/uL (ref 0.1–0.9)
Monocytes: 6 %
Neutrophils Absolute: 4.5 10*3/uL (ref 1.4–7.0)
Neutrophils: 53 %
Platelets: 116 10*3/uL — ABNORMAL LOW (ref 150–450)
RBC: 5.41 x10E6/uL — ABNORMAL HIGH (ref 3.77–5.28)
RDW: 12.2 % (ref 11.7–15.4)
WBC: 8.4 10*3/uL (ref 3.4–10.8)

## 2021-02-10 ENCOUNTER — Other Ambulatory Visit: Payer: Self-pay | Admitting: Nurse Practitioner

## 2021-02-10 ENCOUNTER — Other Ambulatory Visit (HOSPITAL_COMMUNITY): Payer: Self-pay | Admitting: Nurse Practitioner

## 2021-02-10 DIAGNOSIS — K828 Other specified diseases of gallbladder: Secondary | ICD-10-CM | POA: Insufficient documentation

## 2021-02-10 DIAGNOSIS — K625 Hemorrhage of anus and rectum: Secondary | ICD-10-CM | POA: Insufficient documentation

## 2021-02-15 DIAGNOSIS — F17200 Nicotine dependence, unspecified, uncomplicated: Secondary | ICD-10-CM | POA: Insufficient documentation

## 2021-02-20 ENCOUNTER — Ambulatory Visit
Admission: RE | Admit: 2021-02-20 | Discharge: 2021-02-20 | Disposition: A | Discharge status: Home / Self Care | Payer: Medicare Other | Source: Ambulatory Visit | Attending: Nurse Practitioner | Admitting: Nurse Practitioner

## 2021-02-20 DIAGNOSIS — K625 Hemorrhage of anus and rectum: Secondary | ICD-10-CM | POA: Diagnosis not present

## 2021-03-12 LAB — HM COLONOSCOPY

## 2021-03-24 ENCOUNTER — Other Ambulatory Visit (HOSPITAL_COMMUNITY): Payer: Self-pay | Admitting: Nurse Practitioner

## 2021-03-24 ENCOUNTER — Other Ambulatory Visit: Payer: Self-pay | Admitting: Nurse Practitioner

## 2021-03-24 DIAGNOSIS — K760 Fatty (change of) liver, not elsewhere classified: Secondary | ICD-10-CM | POA: Insufficient documentation

## 2021-03-29 DIAGNOSIS — Z86010 Personal history of colonic polyps: Secondary | ICD-10-CM | POA: Insufficient documentation

## 2021-03-29 DIAGNOSIS — K449 Diaphragmatic hernia without obstruction or gangrene: Secondary | ICD-10-CM | POA: Insufficient documentation

## 2021-03-29 DIAGNOSIS — Z860101 Personal history of adenomatous and serrated colon polyps: Secondary | ICD-10-CM | POA: Insufficient documentation

## 2021-03-31 ENCOUNTER — Ambulatory Visit: Payer: Medicare Other

## 2021-04-15 ENCOUNTER — Ambulatory Visit (LOCAL_COMMUNITY_HEALTH_CENTER): Payer: Medicare Other

## 2021-04-15 DIAGNOSIS — Z23 Encounter for immunization: Secondary | ICD-10-CM

## 2021-04-15 DIAGNOSIS — Z719 Counseling, unspecified: Secondary | ICD-10-CM

## 2021-04-15 NOTE — Progress Notes (Signed)
Patient in nurse clinic for Twinrix due to fatty liver and recommended by PCP at [facility]. Patient also requests Tdap because it's been longer than 10 years since last dose. [facility] updated and copy given to patient. Recommended follow up schedule explained and patient verbalized understanding. Ann Held, RN ? ?

## 2021-05-20 ENCOUNTER — Ambulatory Visit (INDEPENDENT_AMBULATORY_CARE_PROVIDER_SITE_OTHER): Payer: Medicare Other

## 2021-05-20 DIAGNOSIS — Z1231 Encounter for screening mammogram for malignant neoplasm of breast: Secondary | ICD-10-CM

## 2021-05-20 DIAGNOSIS — Z0000 Encounter for general adult medical examination without abnormal findings: Secondary | ICD-10-CM

## 2021-05-20 DIAGNOSIS — Z780 Asymptomatic menopausal state: Secondary | ICD-10-CM

## 2021-05-20 NOTE — Progress Notes (Signed)
Subjective:   Taylor Marshall is a 71 y.o. female who presents for an Initial Medicare Annual Wellness Visit.  Virtual Visit via Telephone Note  I connected with  Taylor Marshall on 05/20/21 at  1:30 PM EDT by telephone and verified that I am speaking with the correct person using two identifiers.  Location: Patient: home Provider: [facility] Persons participating in the virtual visit: patient/Nurse Health Advisor   I discussed the limitations, risks, security and privacy concerns of performing an evaluation and management service by telephone and the availability of in person appointments. The patient expressed understanding and agreed to proceed.  Interactive audio and video telecommunications were attempted between this nurse and patient, however failed, due to patient having technical difficulties OR patient did not have access to video capability.  We continued and completed visit with audio only.  Some vital signs may be absent or patient reported.   Taylor Littler, Marshall   Review of Systems     Cardiac Risk Factors include: advanced age (>50men, >54 women)     Objective:    There were no vitals filed for this visit. There is no height or weight on file to calculate BMI.     05/20/2021    1:40 PM 11/15/2014    3:40 PM  Advanced Directives  Does Patient Have a Medical Advance Directive? Yes No  Type of Scientist, research (life sciences) of Healthcare Power of Attorney in Chart? No - copy requested   Would patient like information on creating a medical advance directive?  No - patient declined information    Current Medications (verified) Outpatient Encounter Medications as of 05/20/2021  Medication Sig   cholecalciferol (VITAMIN D3) 25 MCG (1000 UNIT) tablet Take 1,000 Units by mouth daily.   milk thistle 175 MG tablet Take 175 mg by mouth daily.   omeprazole (PRILOSEC) 20 MG capsule Take 20 mg by mouth daily.   [DISCONTINUED] benzocaine (ORAJEL) 10  % mucosal gel Use as directed 1 application in the mouth or throat as needed for mouth pain.   No facility-administered encounter medications on file as of 05/20/2021.    Allergies (verified) Morphine and related and Buprenorphine hcl   History: Past Medical History:  Diagnosis Date   GERD (gastroesophageal reflux disease)    Past Surgical History:  Procedure Laterality Date   COLONOSCOPY  2006?   Dr Maryruth Bun   VAGINAL HYSTERECTOMY     Family History  Problem Relation Age of Onset   Cancer Mother    Hypertension Mother    Diabetes Mother    Hypertension Father    Heart failure Father    Heart disease Father    Atrial fibrillation Father    Social History   Socioeconomic History   Marital status: Single    Spouse name: Not on file   Number of children: Not on file   Years of education: Not on file   Highest education level: Not on file  Occupational History   Not on file  Tobacco Use   Smoking status: Every Day    Types: E-cigarettes   Smokeless tobacco: Never  Vaping Use   Vaping Use: Never used  Substance and Sexual Activity   Alcohol use: No    Alcohol/week: 0.0 standard drinks   Drug use: No   Sexual activity: Not Currently  Other Topics Concern   Not on file  Social History Narrative   Not on file   Social Determinants of  Health   Financial Resource Strain: Low Risk    Difficulty of Paying Living Expenses: Not hard at all  Food Insecurity: No Food Insecurity   Worried About Running Out of Food in the Last Year: Never true   Ran Out of Food in the Last Year: Never true  Transportation Needs: No Transportation Needs   Lack of Transportation (Medical): No   Lack of Transportation (Non-Medical): No  Physical Activity: Inactive   Days of Exercise per Week: 0 days   Minutes of Exercise per Session: 0 min  Stress: Stress Concern Present   Feeling of Stress : To some extent  Social Connections: Moderately Isolated   Frequency of Communication with  Friends and Family: More than three times a week   Frequency of Social Gatherings with Friends and Family: More than three times a week   Attends Religious Services: Never   Database administratorActive Member of Clubs or Organizations: No   Attends Engineer, structuralClub or Organization Meetings: Never   Marital Status: Living with partner    Tobacco Counseling Ready to quit: Not Answered Counseling given: Not Answered   Clinical Intake:  Pre-visit preparation completed: Yes  Pain : No/denies pain     Nutritional Risks: None Diabetes: No  How often do you need to have someone help you when you read instructions, pamphlets, or other written materials from your doctor or pharmacy?: 1 - Never    Interpreter Needed?: No  Information entered by :: Taylor LittlerKasey Kathey Simer Marshall   Activities of Daily Living    05/20/2021    1:43 PM  In your present state of health, do you have any difficulty performing the following activities:  Hearing? 0  Vision? 1  Difficulty concentrating or making decisions? 0  Walking or climbing stairs? 0  Dressing or bathing? 0  Doing errands, shopping? 0  Preparing Food and eating ? N  Using the Toilet? N  In the past six months, have you accidently leaked urine? N  Do you have problems with loss of bowel control? N  Managing your Medications? N  Managing your Finances? N  Housekeeping or managing your Housekeeping? N    Patient Care Team: Duanne LimerickJones, Deanna C, MD as PCP - General (Family Medicine)  Indicate any recent Medical Services you may have received from other than [facility] providers in the past year (date may be approximate).     Assessment:   This is a routine wellness examination for Taylor Marshall.  Hearing/Vision screen Hearing Screening - Comments:: Pt denies hearing difficulty Vision Screening - Comments:: Past due for eye exam; [facility]  Dietary issues and exercise activities discussed: Current Exercise Habits: The patient does not participate in  regular exercise at present, Exercise limited by: None identified   Goals Addressed   None    Depression Screen    05/20/2021    1:38 PM 02/05/2021    1:36 PM 01/01/2020    2:14 PM 03/28/2017    9:58 AM  PHQ 2/9 Scores  PHQ - 2 Score 0 4 0 2  PHQ- 9 Score  4 0 12    Fall Risk    05/20/2021    1:43 PM 02/05/2021    1:37 PM 01/01/2020    2:11 PM  Fall Risk   Falls in the past year? 1 1 1   Number falls in past yr: 0 0 0  Injury with Fall? 1 0 1  Risk for fall due to : History of fall(s) Other (  Comment)   Follow up Falls prevention discussed Falls evaluation completed Falls evaluation completed    FALL RISK PREVENTION PERTAINING TO THE HOME:  Any stairs in or around the home? Yes  If so, are there any without handrails? No  Home free of loose throw rugs in walkways, pet beds, electrical cords, etc? Yes  Adequate lighting in your home to reduce risk of falls? Yes   ASSISTIVE DEVICES UTILIZED TO PREVENT FALLS:  Life alert? No  Use of a cane, walker or w/c? No  Grab bars in the bathroom? No  Shower chair or bench in shower? Yes  Elevated toilet seat or a handicapped toilet? No   TIMED UP AND GO:  Was the test performed? No . Telephonic visit  Cognitive Function: Normal cognitive status assessed by direct observation by this Nurse Health Advisor. No abnormalities found.          Immunizations Immunization History  Administered Date(s) Administered   Fluad Quad(high Dose 65+) 01/01/2020   Hep A / Hep B 04/15/2021   PFIZER(Purple Top)SARS-COV-2 Vaccination 02/11/2019, 03/07/2019, 10/09/2019   Tdap 04/15/2021    TDAP status: Up to date  Flu Vaccine status: Due, Education has been provided regarding the importance of this vaccine. Advised may receive this vaccine at local pharmacy or Health Dept. Aware to provide a copy of the vaccination record if obtained from local pharmacy or Health Dept. Verbalized acceptance and understanding.  Pneumococcal vaccine status:  Declined,  Education has been provided regarding the importance of this vaccine but patient still declined. Advised may receive this vaccine at local pharmacy or Health Dept. Aware to provide a copy of the vaccination record if obtained from local pharmacy or Health Dept. Verbalized acceptance and understanding.   Covid-19 vaccine status: Completed vaccines  Qualifies for Shingles Vaccine? Yes   Zostavax completed No   Shingrix Completed?: No.    Education has been provided regarding the importance of this vaccine. Patient has been advised to call insurance company to determine out of pocket expense if they have not yet received this vaccine. Advised may also receive vaccine at local pharmacy or Health Dept. Verbalized acceptance and understanding.  Screening Tests Health Maintenance  Topic Date Due   Pneumonia Vaccine 38+ Years old (1 - PCV) Never done   Hepatitis C Screening  Never done   MAMMOGRAM  Never done   Zoster Vaccines- Shingrix (1 of 2) Never done   DEXA SCAN  Never done   COVID-19 Vaccine (4 - Booster for Pfizer series) 12/04/2019   INFLUENZA VACCINE  08/04/2021   COLONOSCOPY (Pts 45-53yrs Insurance coverage will need to be confirmed)  03/12/2028   TETANUS/TDAP  04/16/2031   HPV VACCINES  Aged Out    Health Maintenance  Health Maintenance Due  Topic Date Due   Pneumonia Vaccine 31+ Years old (1 - PCV) Never done   Hepatitis C Screening  Never done   MAMMOGRAM  Never done   Zoster Vaccines- Shingrix (1 of 2) Never done   DEXA SCAN  Never done   COVID-19 Vaccine (4 - Booster for Pfizer series) 12/04/2019    Colorectal cancer screening: Type of screening: Colonoscopy. Completed 03/12/21. Repeat every 7 years  Mammogram status: Ordered today. Pt provided with contact info and advised to call to schedule appt.   Bone Density status: Ordered today. Pt provided with contact info and advised to call to schedule appt.  Lung Cancer Screening: (Low Dose CT Chest recommended  if Age 66-80 years, 60  pack-year currently smoking OR have quit w/in 15years.) does not qualify.   Additional Screening:  Hepatitis C Screening: does qualify; due  Vision Screening: Recommended annual ophthalmology exams for early detection of glaucoma and other disorders of the eye. Is the patient up to date with their annual eye exam?  No  Who is the provider or what is the name of the office in which the patient attends annual eye exams? Not established If pt is not established with a provider, would they like to be referred to a provider to establish care? No .   Dental Screening: Recommended annual dental exams for proper oral hygiene  Community Resource Referral / Chronic Care Management: CRR required this visit?  No   CCM required this visit?  No      Plan:     I have personally reviewed and noted the following in the patient's chart:   Medical and social history Use of alcohol, tobacco or illicit drugs  Current medications and supplements including opioid prescriptions. Patient is not currently taking opioid prescriptions. Functional ability and status Nutritional status Physical activity Advanced directives List of other physicians Hospitalizations, surgeries, and ER visits in previous 12 months Vitals Screenings to include cognitive, depression, and falls Referrals and appointments  In addition, I have reviewed and discussed with patient certain preventive protocols, quality metrics, and best practice recommendations. A written personalized care plan for preventive services as well as general preventive health recommendations were provided to patient.     Taylor Littler, Marshall   07/07/5007   Nurse Notes: none

## 2021-05-20 NOTE — Patient Instructions (Signed)
Taylor Marshall , ?Thank you for taking time to come for your Medicare Wellness Visit. I appreciate your ongoing commitment to your health goals. Please review the following plan we discussed and let me know if I can assist you in the future.  ? ?Screening recommendations/referrals: ?Colonoscopy: done 03/12/21 ?Mammogram: due. Please call [phone] to schedule your mammogram.  ?Bone Density: due ?Recommended yearly ophthalmology/optometry visit for glaucoma screening and checkup ?Recommended yearly dental visit for hygiene and checkup ? ?Vaccinations: ?Influenza vaccine: due fall 2023 ?Pneumococcal vaccine: declined ?Tdap vaccine: done 04/15/21 ?Shingles vaccine: Shingrix discussed. Please contact your pharmacy for coverage information.  ?Covid-19:done 02/11/19, 03/07/19, 10/09/19 & 05/01/20 ? ?Advanced directives: Advance directive discussed with you today. I have provided a copy for you to complete at home and have notarized. Once this is complete please bring a copy in to our office so we can scan it into your chart.  ? ?Conditions/risks identified: recommend increasing physical activity  ? ?Next appointment: Follow up in one year for your annual wellness visit  ? ? ?Preventive Care 30 Years and Older, Female ?Preventive care refers to lifestyle choices and visits with your health care provider that can promote health and wellness. ?What does preventive care include? ?A yearly physical exam. This is also called an annual well check. ?Dental exams once or twice a year. ?Routine eye exams. Ask your health care provider how often you should have your eyes checked. ?Personal lifestyle choices, including: ?Daily care of your teeth and gums. ?Regular physical activity. ?Eating a healthy diet. ?Avoiding tobacco and drug use. ?Limiting alcohol use. ?Practicing safe sex. ?Taking low-dose aspirin every day. ?Taking vitamin and mineral supplements as recommended by your health care provider. ?What happens during an annual well  check? ?The services and screenings done by your health care provider during your annual well check will depend on your age, overall health, lifestyle risk factors, and family history of disease. ?Counseling  ?Your health care provider may ask you questions about your: ?Alcohol use. ?Tobacco use. ?Drug use. ?Emotional well-being. ?Home and relationship well-being. ?Sexual activity. ?Eating habits. ?History of falls. ?Memory and ability to understand (cognition). ?Work and work Astronomer. ?Reproductive health. ?Screening  ?You may have the following tests or measurements: ?Height, weight, and BMI. ?Blood pressure. ?Lipid and cholesterol levels. These may be checked every 5 years, or more frequently if you are over 25 years old. ?Skin check. ?Lung cancer screening. You may have this screening every year starting at age 42 if you have a 30-pack-year history of smoking and currently smoke or have quit within the past 15 years. ?Fecal occult blood test (FOBT) of the stool. You may have this test every year starting at age 53. ?Flexible sigmoidoscopy or colonoscopy. You may have a sigmoidoscopy every 5 years or a colonoscopy every 10 years starting at age 61. ?Hepatitis C blood test. ?Hepatitis B blood test. ?Sexually transmitted disease (STD) testing. ?Diabetes screening. This is done by checking your blood sugar (glucose) after you have not eaten for a while (fasting). You may have this done every 1-3 years. ?Bone density scan. This is done to screen for osteoporosis. You may have this done starting at age 76. ?Mammogram. This may be done every 1-2 years. Talk to your health care provider about how often you should have regular mammograms. ?Talk with your health care provider about your test results, treatment options, and if necessary, the need for more tests. ?Vaccines  ?Your health care provider may recommend certain vaccines, such as: ?  Influenza vaccine. This is recommended every year. ?Tetanus, diphtheria, and  acellular pertussis (Tdap, Td) vaccine. You may need a Td booster every 10 years. ?Zoster vaccine. You may need this after age 65. ?Pneumococcal 13-valent conjugate (PCV13) vaccine. One dose is recommended after age 46. ?Pneumococcal polysaccharide (PPSV23) vaccine. One dose is recommended after age 38. ?Talk to your health care provider about which screenings and vaccines you need and how often you need them. ?This information is not intended to replace advice given to you by your health care provider. Make sure you discuss any questions you have with your health care provider. ?Document Released: 01/17/2015 Document Revised: 09/10/2015 Document Reviewed: 10/22/2014 ?Elsevier Interactive Patient Education ? 2017 [employer]. ? ?Fall Prevention in the Home ?Falls can cause injuries. They can happen to people of all ages. There are many things you can do to make your home safe and to help prevent falls. ?What can I do on the outside of my home? ?Regularly fix the edges of walkways and driveways and fix any cracks. ?Remove anything that might make you trip as you walk through a door, such as a raised step or threshold. ?Trim any bushes or trees on the path to your home. ?Use bright outdoor lighting. ?Clear any walking paths of anything that might make someone trip, such as rocks or tools. ?Regularly check to see if handrails are loose or broken. Make sure that both sides of any steps have handrails. ?Any raised decks and porches should have guardrails on the edges. ?Have any leaves, snow, or ice cleared regularly. ?Use sand or salt on walking paths during winter. ?Clean up any spills in your garage right away. This includes oil or grease spills. ?What can I do in the bathroom? ?Use night lights. ?Install grab bars by the toilet and in the tub and shower. Do not use towel bars as grab bars. ?Use non-skid mats or decals in the tub or shower. ?If you need to sit down in the shower, use a plastic, non-slip stool. ?Keep  the floor dry. Clean up any water that spills on the floor as soon as it happens. ?Remove soap buildup in the tub or shower regularly. ?Attach bath mats securely with double-sided non-slip rug tape. ?Do not have throw rugs and other things on the floor that can make you trip. ?What can I do in the bedroom? ?Use night lights. ?Make sure that you have a light by your bed that is easy to reach. ?Do not use any sheets or blankets that are too big for your bed. They should not hang down onto the floor. ?Have a firm chair that has side arms. You can use this for support while you get dressed. ?Do not have throw rugs and other things on the floor that can make you trip. ?What can I do in the kitchen? ?Clean up any spills right away. ?Avoid walking on wet floors. ?Keep items that you use a lot in easy-to-reach places. ?If you need to reach something above you, use a strong step stool that has a grab bar. ?Keep electrical cords out of the way. ?Do not use floor polish or wax that makes floors slippery. If you must use wax, use non-skid floor wax. ?Do not have throw rugs and other things on the floor that can make you trip. ?What can I do with my stairs? ?Do not leave any items on the stairs. ?Make sure that there are handrails on both sides of the stairs and use them.  Fix handrails that are broken or loose. Make sure that handrails are as long as the stairways. ?Check any carpeting to make sure that it is firmly attached to the stairs. Fix any carpet that is loose or worn. ?Avoid having throw rugs at the top or bottom of the stairs. If you do have throw rugs, attach them to the floor with carpet tape. ?Make sure that you have a light switch at the top of the stairs and the bottom of the stairs. If you do not have them, ask someone to add them for you. ?What else can I do to help prevent falls? ?Wear shoes that: ?Do not have high heels. ?Have rubber bottoms. ?Are comfortable and fit you well. ?Are closed at the toe. Do not  wear sandals. ?If you use a stepladder: ?Make sure that it is fully opened. Do not climb a closed stepladder. ?Make sure that both sides of the stepladder are locked into place. ?Ask someone to hold it for you,

## 2021-05-26 ENCOUNTER — Telehealth: Payer: Self-pay

## 2021-05-26 NOTE — Telephone Encounter (Signed)
Called with mammo and bone density appt scheduled in [place] for 06/25/21 @ 10:20

## 2021-06-15 ENCOUNTER — Ambulatory Visit (INDEPENDENT_AMBULATORY_CARE_PROVIDER_SITE_OTHER): Payer: Medicare Other | Admitting: Family Medicine

## 2021-06-15 ENCOUNTER — Ambulatory Visit: Payer: Medicare Other | Admitting: Family Medicine

## 2021-06-15 ENCOUNTER — Encounter: Payer: Self-pay | Admitting: Family Medicine

## 2021-06-15 ENCOUNTER — Ambulatory Visit: Payer: Self-pay | Admitting: Family Medicine

## 2021-06-15 VITALS — BP 138/80 | HR 76 | Ht 67.0 in | Wt 182.0 lb

## 2021-06-15 DIAGNOSIS — L309 Dermatitis, unspecified: Secondary | ICD-10-CM

## 2021-06-15 MED ORDER — PERMETHRIN 5 % EX CREA: 1.0000 "application " | TOPICAL_CREAM | Freq: Once | CUTANEOUS | 0 refills | Status: AC

## 2021-06-15 MED ORDER — TRIAMCINOLONE ACETONIDE 0.1 % EX CREA: 1.0000 "application " | TOPICAL_CREAM | Freq: Two times a day (BID) | CUTANEOUS | 0 refills | Status: DC

## 2021-06-15 MED ORDER — HYDROXYZINE HCL 10 MG PO TABS: 10.0000 mg | ORAL_TABLET | Freq: Every evening | ORAL | 0 refills | Status: DC | PRN

## 2021-06-15 NOTE — Progress Notes (Signed)
Date:  06/15/2021   Name:  Taylor Marshall   DOB:  [birth year]   MRN:  [id number]   Chief Complaint: insect bites  HPI  Lab Results  Component Value Date   NA 141 11/27/2014   K 4.9 11/27/2014   CO2 27 11/27/2014   GLUCOSE 104 (H) 11/27/2014   BUN 8 11/27/2014   CREATININE 0.87 11/27/2014   CALCIUM 10.1 11/27/2014   GFRNONAA >60 11/27/2014   No results found for: "CHOL", "HDL", "LDLCALC", "LDLDIRECT", "TRIG", "CHOLHDL" No results found for: "TSH" No results found for: "HGBA1C" Lab Results  Component Value Date   WBC 8.4 02/05/2021   HGB 14.4 02/05/2021   HCT 44.0 02/05/2021   MCV 81 02/05/2021   PLT 116 (L) 02/05/2021   Lab Results  Component Value Date   ALT 18 11/27/2014   AST 27 11/27/2014   ALKPHOS 80 11/27/2014   BILITOT 0.3 11/27/2014   No results found for: "25OHVITD2", "25OHVITD3", "VD25OH"   Review of Systems  Constitutional:  Negative for fever.  Skin:  Positive for rash.  Psychiatric/Behavioral:  Suicidal ideas: pruritic.     Patient Active Problem List   Diagnosis Date Noted   Hiatal hernia 03/29/2021   H/O adenomatous polyp of colon 03/29/2021   Hepatic steatosis 03/24/2021   Nicotine dependence due to vaping non-tobacco product 02/15/2021   Gallbladder sludge 02/10/2021   Bright red rectal bleeding 02/10/2021   Left flank pain 02/07/2013    Allergies  Allergen Reactions   Morphine And Related Nausea Only   Buprenorphine Hcl Nausea Only    Past Surgical History:  Procedure Laterality Date   COLONOSCOPY  2006?   Dr Maryruth Bun   VAGINAL HYSTERECTOMY      Social History   Tobacco Use   Smoking status: Every Day    Types: E-cigarettes   Smokeless tobacco: Never  Vaping Use   Vaping Use: Never used  Substance Use Topics   Alcohol use: No    Alcohol/week: 0.0 standard drinks of alcohol   Drug use: No     Medication list has been reviewed and updated.  Current Meds  Medication Sig   cholecalciferol (VITAMIN D3) 25 MCG (1000  UNIT) tablet Take 1,000 Units by mouth daily.   milk thistle 175 MG tablet Take 175 mg by mouth daily.   omeprazole (PRILOSEC) 20 MG capsule Take 20 mg by mouth daily.       06/15/2021   10:58 AM 02/05/2021    1:37 PM 01/01/2020    2:15 PM  GAD 7 : Generalized Anxiety Score  Nervous, Anxious, on Edge 0 2 0  Control/stop worrying 0 2 0  Worry too much - different things 0 3 0  Trouble relaxing 0 0 0  Restless 0 0 0  Easily annoyed or irritable 0 0 0  Afraid - awful might happen 0 2 0  Total GAD 7 Score 0 9 0  Anxiety Difficulty Not difficult at all Somewhat difficult        06/15/2021   10:58 AM  Depression screen PHQ 2/9  Decreased Interest 0  Down, Depressed, Hopeless 0  PHQ - 2 Score 0  Altered sleeping 0  Tired, decreased energy 0  Change in appetite 0  Feeling bad or failure about yourself  0  Trouble concentrating 0  Moving slowly or fidgety/restless 0  Suicidal thoughts 0  PHQ-9 Score 0  Difficult doing work/chores Not difficult at all    BP Readings from Last  3 Encounters:  06/15/21 138/80  02/05/21 128/84  01/29/21 (!) 133/99    Physical Exam Skin:    Findings: Erythema and rash present. No petechiae. Rash is macular.     Wt Readings from Last 3 Encounters:  06/15/21 182 lb (82.6 kg)  02/05/21 182 lb (82.6 kg)  01/01/20 173 lb (78.5 kg)    BP 138/80   Pulse 76   Ht 5\' 7"  (1.702 m)   Wt 182 lb (82.6 kg)   BMI 28.51 kg/m   Assessment and Plan:  1. Dermatitis New onset.  Patient was cleaning out an RV over the weekend and later that night felt pruritic and noticed a rash on her arm the flexor surface of her lower arm and base of the neck.  The area was erythematous with some induration is very unlikely to be secondary to scabies given the timing that was almost immediate it usually takes 3 to 6 weeks and scabies does not live on fomite's very long and this event in an RV that have been vacated for months.  More likely was an insect of some type and  we will treat with triamcinolone cream, Zyrtec in the morning, and hydroxyzine at night.  Patient has been given a prescription for Elimite and she may hold it and she if she so chooses to do so she can use it on the areas of concern which she will leave on for 8 hours to 14 hours and rinse off.  Patient showers 1-2 times a day on the usual basis which is unlikely to allow any infestation to remain regardless of possible except for scabies which does not fit the pattern of exposure. - permethrin (ELIMITE) 5 % cream; Apply 1 application  topically once for 1 dose.  Dispense: 1 g; Refill: 0 - hydrOXYzine (ATARAX) 10 MG tablet; Take 1 tablet (10 mg total) by mouth at bedtime as needed.  Dispense: 30 tablet; Refill: 0 - triamcinolone cream (KENALOG) 0.1 %; Apply 1 application  topically 2 (two) times daily.  Dispense: 30 g; Refill: 0

## 2021-06-15 NOTE — Telephone Encounter (Signed)
  Chief Complaint: ? scabbies Symptoms: itchy rash Frequency: constant Pertinent Negatives: Patient denies fever Disposition: [] ED /[] Urgent Care (no appt availability in office) / [x] Appointment(In office/virtual)/ []  [place] [facility]/ [] Home Care/ [] Refused Recommended Disposition /[] [place] [facility]/ []  Follow-up with PCP Additional Notes: Pt bought used RV and cleaned it out and got rash which she looked up and states is scabbies, virtual visit made.   Reason for Disposition  [1] Looks infected (spreading redness, pus) AND [2] no fever  Answer Assessment - Initial Assessment Questions 1. PLACE OF EXPOSURE: "Where were you when you were exposed?" (e.g., home, work)    Rash on left side neck and right arm 2. TYPE OF EXPOSURE: "How were you exposed?" "How much contact was there?"     Bought a used RV and were cleaning it 3. DATE OF EXPOSURE: "When did the exposure occur?" (e.g., days)     Wednesday 6/7 4. SYMPTOMS: "Do you have any symptoms?" (e.g., itching, bumpy rash)  If Yes, ask: "What does it look like?" "Which parts of your body are affected?"     Itching severely, trying not to scratch, neck and arm 5. PRIOR HISTORY: "Have you ever had scabies before?"     no 6. CLOSE CONTACTS: "Does any person who lives with you or has had close contact with you have itching?"     Could have been from used RV just bought.  Protocols used: Scabies Exposure-A-AH

## 2021-06-15 NOTE — Patient Instructions (Signed)
Permethrin Cream What is this medication? PERMETHRIN (per METH rin) treats scabies, a condition caused by tiny insects that irritate your skin. It works by killing the mites and their eggs. This medicine may be used for other purposes; ask your health care provider or pharmacist if you have questions. COMMON BRAND NAME(S): Acticin, Elimite What should I tell my care team before I take this medication? They need to know if you have any of these conditions: Asthma An unusual or allergic reaction to permethrin, veterinary or household insecticides, chrysanthemums, other medications, foods, dyes, or preservatives Pregnant or trying to get pregnant Breast-feeding How should I use this medication? This medication is for external use only. Do not take by mouth. Follow the directions on the prescription label. A bath or shower is not recommended before applying this medication. Thoroughly rub the cream into all skin surfaces, from your head to the soles of your feet. It is important to apply it everywhere on your body, not just where the rash is. Apply the cream between fingers and toe creases, in the folds of the wrist and waistline, in the cleft of the buttocks, on the genitals, and in the belly button. Use a toothpick to apply the cream beneath your fingernails and toenails. Nails should be cut short. If you have little or no hair, or you are applying the cream to an infant or young child, make sure you rub the cream into the neck, scalp, hairline, temples, and forehead. Leave it on for 8 to 14 hours, then remove it by bathing and shampooing. If you are applying this medication to another person, wear plastic or disposable gloves to protect yourself from infestation. Do not get this medication in your eyes. If you do, rinse out with plenty of cool tap water. Talk to your care team about the use of this medication in children. While this medication may be prescribed for children as young as 7 months of age  for selected conditions, precautions do apply. Overdosage: If you think you have taken too much of this medicine contact a poison control center or emergency room at once. NOTE: This medicine is only for you. Do not share this medicine with others. What if I miss a dose? This does not apply. What may interact with this medication? Interactions are not expected. Do not use any other skin products on the affected area without telling your care team. This list may not describe all possible interactions. Give your health care provider a list of all the medicines, herbs, non-prescription drugs, or dietary supplements you use. Also tell them if you smoke, drink alcohol, or use illegal drugs. Some items may interact with your medicine. What should I watch for while using this medication? It is not unusual for itching and rash to continue for as long as 2 to 4 weeks after treatment. These symptoms may be a temporary reaction to the remains of the mites. This does not mean this cream did not work or that it needs to be reapplied. If you feel that the itching and rash is intense or if it continues beyond 4 weeks, talk to your care team right away. Scabies is spread by direct skin contact with an infected person. Family members and sexual partners may require treatment with this medication. You should discuss this with your care team. Using a normal washing cycle, you should wash all clothing, towels, and bed linens that have touched your skin. You do not need to rewash clean clothing that has  not yet been worn. Coats, furniture, rugs, floors, and walls do not need to be cleaned in any special manner. What side effects may I notice from receiving this medication? Side effects that you should report to your care team as soon as possible: Allergic reactions--skin rash, itching, hives, swelling of the face, lips, tongue, or throat Side effects that usually do not require medical attention (report to your care team  if they continue or are bothersome): Burning or pain at application site Mild skin irritation, redness, or dryness This list may not describe all possible side effects. Call your doctor for medical advice about side effects. You may report side effects to FDA at 1-800-FDA-1088. Where should I keep my medication? Keep out of the reach of children and pets. Store at room temperature between 20 and 25 degrees C (68 and 77 degrees F). Do not refrigerate. Do not freeze. Get rid of any unused medication after the expiration date. To get rid of medications that are no longer needed or have expired: Take the medication to a medication take-back program. Check with your pharmacy or law enforcement to find a location. If you cannot return the medication, check the label or package insert to see if the medication should be thrown out in the garbage or flushed down the toilet. If you are not sure, ask your care team. If it is safe to put it in the trash, take the medication out of the container. Mix the medication with cat litter, dirt, coffee grounds, or other unwanted substance. Seal the mixture in a bag or container. Put it in the trash. NOTE: This sheet is a summary. It may not cover all possible information. If you have questions about this medicine, talk to your doctor, pharmacist, or health care provider.  2023 Elsevier/Gold Standard (2021-02-16 00:00:00)

## 2021-06-16 ENCOUNTER — Ambulatory Visit: Payer: Medicare Other

## 2021-06-25 ENCOUNTER — Ambulatory Visit
Admission: RE | Admit: 2021-06-25 | Discharge: 2021-06-25 | Disposition: A | Discharge status: Home / Self Care | Payer: Medicare Other | Source: Ambulatory Visit | Attending: Family Medicine | Admitting: Family Medicine

## 2021-06-25 DIAGNOSIS — Z1231 Encounter for screening mammogram for malignant neoplasm of breast: Secondary | ICD-10-CM | POA: Diagnosis present

## 2021-06-25 DIAGNOSIS — Z780 Asymptomatic menopausal state: Secondary | ICD-10-CM | POA: Insufficient documentation

## 2021-06-26 ENCOUNTER — Other Ambulatory Visit: Payer: Self-pay | Admitting: Family Medicine

## 2021-06-26 DIAGNOSIS — N6489 Other specified disorders of breast: Secondary | ICD-10-CM

## 2021-06-26 DIAGNOSIS — R928 Other abnormal and inconclusive findings on diagnostic imaging of breast: Secondary | ICD-10-CM

## 2021-07-09 ENCOUNTER — Ambulatory Visit
Admission: RE | Admit: 2021-07-09 | Discharge: 2021-07-09 | Disposition: A | Discharge status: Home / Self Care | Payer: Medicare Other | Source: Ambulatory Visit | Attending: Family Medicine | Admitting: Family Medicine

## 2021-07-09 DIAGNOSIS — R928 Other abnormal and inconclusive findings on diagnostic imaging of breast: Secondary | ICD-10-CM | POA: Diagnosis present

## 2021-07-09 DIAGNOSIS — N6489 Other specified disorders of breast: Secondary | ICD-10-CM | POA: Diagnosis present

## 2021-07-12 ENCOUNTER — Other Ambulatory Visit: Payer: Self-pay | Admitting: Family Medicine

## 2021-07-12 DIAGNOSIS — L309 Dermatitis, unspecified: Secondary | ICD-10-CM

## 2021-09-28 ENCOUNTER — Ambulatory Visit: Payer: Medicare Other | Admitting: Family Medicine

## 2021-09-28 ENCOUNTER — Emergency Department
Admission: EM | Admit: 2021-09-28 | Discharge: 2021-09-28 | Disposition: A | Discharge status: Home / Self Care | Payer: Medicare Other | Attending: Emergency Medicine | Admitting: Emergency Medicine

## 2021-09-28 ENCOUNTER — Other Ambulatory Visit: Payer: Self-pay

## 2021-09-28 ENCOUNTER — Emergency Department: Payer: Medicare Other

## 2021-09-28 DIAGNOSIS — R197 Diarrhea, unspecified: Secondary | ICD-10-CM | POA: Insufficient documentation

## 2021-09-28 DIAGNOSIS — D72829 Elevated white blood cell count, unspecified: Secondary | ICD-10-CM | POA: Insufficient documentation

## 2021-09-28 DIAGNOSIS — M545 Low back pain, unspecified: Secondary | ICD-10-CM | POA: Diagnosis not present

## 2021-09-28 DIAGNOSIS — M533 Sacrococcygeal disorders, not elsewhere classified: Secondary | ICD-10-CM | POA: Insufficient documentation

## 2021-09-28 DIAGNOSIS — R110 Nausea: Secondary | ICD-10-CM | POA: Diagnosis not present

## 2021-09-28 DIAGNOSIS — R000 Tachycardia, unspecified: Secondary | ICD-10-CM | POA: Diagnosis not present

## 2021-09-28 DIAGNOSIS — M549 Dorsalgia, unspecified: Secondary | ICD-10-CM | POA: Diagnosis present

## 2021-09-28 DIAGNOSIS — R1032 Left lower quadrant pain: Secondary | ICD-10-CM | POA: Diagnosis not present

## 2021-09-28 LAB — URINALYSIS, ROUTINE W REFLEX MICROSCOPIC
Bacteria, UA: NONE SEEN
Bilirubin Urine: NEGATIVE
Glucose, UA: NEGATIVE mg/dL
Ketones, ur: NEGATIVE mg/dL
Leukocytes,Ua: NEGATIVE
Nitrite: NEGATIVE
Protein, ur: NEGATIVE mg/dL
Specific Gravity, Urine: 1.021 (ref 1.005–1.030)
pH: 8 (ref 5.0–8.0)

## 2021-09-28 LAB — COMPREHENSIVE METABOLIC PANEL
ALT: 13 U/L (ref 0–44)
AST: 20 U/L (ref 15–41)
Albumin: 4.1 g/dL (ref 3.5–5.0)
Alkaline Phosphatase: 88 U/L (ref 38–126)
Anion gap: 7 (ref 5–15)
BUN: 12 mg/dL (ref 8–23)
CO2: 22 mmol/L (ref 22–32)
Calcium: 9.6 mg/dL (ref 8.9–10.3)
Chloride: 108 mmol/L (ref 98–111)
Creatinine, Ser: 0.75 mg/dL (ref 0.44–1.00)
GFR, Estimated: >60 mL/min (ref >60)
Glucose, Bld: 141 mg/dL — ABNORMAL HIGH (ref 70–99)
Potassium: 4 mmol/L (ref 3.5–5.1)
Sodium: 137 mmol/L (ref 135–145)
Total Bilirubin: 0.9 mg/dL (ref 0.3–1.2)
Total Protein: 7.8 g/dL (ref 6.5–8.1)

## 2021-09-28 LAB — CBC
HCT: 43.9 % (ref 36.0–46.0)
Hemoglobin: 14 g/dL (ref 12.0–15.0)
MCH: 26.3 pg (ref 26.0–34.0)
MCHC: 31.9 g/dL (ref 30.0–36.0)
MCV: 82.5 fL (ref 80.0–100.0)
Platelets: 274 10*3/uL (ref 150–400)
RBC: 5.32 MIL/uL — ABNORMAL HIGH (ref 3.87–5.11)
RDW: 12.5 % (ref 11.5–15.5)
WBC: 12.5 10*3/uL — ABNORMAL HIGH (ref 4.0–10.5)
nRBC: 0 % (ref 0.0–0.2)

## 2021-09-28 LAB — LIPASE, BLOOD: Lipase: 32 U/L (ref 11–51)

## 2021-09-28 MED ORDER — LIDOCAINE 5 % EX PTCH: 1.0000 | MEDICATED_PATCH | CUTANEOUS | 0 refills | Status: DC

## 2021-09-28 MED ORDER — LACTATED RINGERS IV BOLUS
1000.0000 mL | Freq: Once | INTRAVENOUS | Status: AC
  Administered 2021-09-28: 1000 mL via INTRAVENOUS

## 2021-09-28 MED ORDER — ONDANSETRON HCL 4 MG/2ML IJ SOLN
4.0000 mg | Freq: Once | INTRAMUSCULAR | Status: AC
  Administered 2021-09-28: 4 mg via INTRAVENOUS
  Filled 2021-09-28: qty 2

## 2021-09-28 MED ORDER — IOHEXOL 300 MG/ML  SOLN
100.0000 mL | Freq: Once | INTRAMUSCULAR | Status: AC | PRN
  Administered 2021-09-28: 100 mL via INTRAVENOUS

## 2021-09-28 MED ORDER — MORPHINE SULFATE (PF) 4 MG/ML IV SOLN
4.0000 mg | Freq: Once | INTRAVENOUS | Status: AC
  Administered 2021-09-28: 4 mg via INTRAVENOUS
  Filled 2021-09-28: qty 1

## 2021-09-28 NOTE — ED Provider Notes (Signed)
[facility] Provider Note    Event Date/Time   First MD Initiated Contact with Patient 09/28/21 1215     (approximate)   History   Abdominal Pain and Nausea   HPI  Taylor Marshall is a 71 y.o. female with past medical history of GERD who presents with back and abdominal pain.  Patient was bending down today when she stood up and felt acute onset of pain in the left back radiating around to the left abdomen.  She has since had fairly constant pain.  She has also had nausea and chills.  Denies significant urinary symptoms.  Has had several episodes of diarrhea as well but no constipation.  Denies history of similar pain.  No history of kidney stones.  Denies chest pain.     Past Medical History:  Diagnosis Date   GERD (gastroesophageal reflux disease)     Patient Active Problem List   Diagnosis Date Noted   Hiatal hernia 03/29/2021   H/O adenomatous polyp of colon 03/29/2021   Hepatic steatosis 03/24/2021   Nicotine dependence due to vaping non-tobacco product 02/15/2021   Gallbladder sludge 02/10/2021   Bright red rectal bleeding 02/10/2021   Left flank pain 02/07/2013     Physical Exam  Triage Vital Signs: ED Triage Vitals  Enc Vitals Group     BP 09/28/21 1130 96/71     Pulse Rate 09/28/21 1130 (!) 112     Resp 09/28/21 1130 17     Temp 09/28/21 1130 99.2 F (37.3 C)     Temp Source 09/28/21 1130 Oral     SpO2 09/28/21 1130 92 %     Weight --      Height --      Head Circumference --      Peak Flow --      Pain Score 09/28/21 1131 6     Pain Loc --      Pain Edu? --      Excl. in GC? --     Most recent vital signs: Vitals:   09/28/21 1130 09/28/21 1430  BP: 96/71 118/78  Pulse: (!) 112 98  Resp: 17 16  Temp: 99.2 F (37.3 C) 99.1 F (37.3 C)  SpO2: 92% 97%     General: Awake, no distress.  CV:  Good peripheral perfusion.  Resp:  Normal effort.  Abd:  No distention.  Mild tenderness palpation left lower quadrant of  the abdomen, soft no guarding Neuro:             Awake, Alert, Oriented x 3  Other:  Tenderness to palpation over the left SI region 5 out of 5 strength with plantarflexion dorsiflexion and hip extension   ED Results / Procedures / Treatments  Labs (all labs ordered are listed, but only abnormal results are displayed) Labs Reviewed  COMPREHENSIVE METABOLIC PANEL - Abnormal; Notable for the following components:      Result Value   Glucose, Bld 141 (*)    All other components within normal limits  CBC - Abnormal; Notable for the following components:   WBC 12.5 (*)    RBC 5.32 (*)    All other components within normal limits  URINALYSIS, ROUTINE W REFLEX MICROSCOPIC - Abnormal; Notable for the following components:   Color, Urine YELLOW (*)    APPearance HAZY (*)    Hgb urine dipstick SMALL (*)    All other components within normal limits  LIPASE, BLOOD  EKG     RADIOLOGY I reviewed and interpreted the CT of the abdomen pelvis which is negative for acute abnormality   PROCEDURES:  Critical Care performed: No  Procedures    MEDICATIONS ORDERED IN ED: Medications  lactated ringers bolus 1,000 mL (0 mLs Intravenous Stopped 09/28/21 1453)  morphine (PF) 4 MG/ML injection 4 mg (4 mg Intravenous Given 09/28/21 1250)  ondansetron (ZOFRAN) injection 4 mg (4 mg Intravenous Given 09/28/21 1250)  iohexol (OMNIPAQUE) 300 MG/ML solution 100 mL (100 mLs Intravenous Contrast Given 09/28/21 1318)     IMPRESSION / MDM / ASSESSMENT AND PLAN / ED COURSE  I reviewed the triage vital signs and the nursing notes.                              Patient's presentation is most consistent with acute presentation with potential threat to life or bodily function.  Differential diagnosis includes, but is not limited to, kidney stone, AAA diverticulitis, lumbar strain, psoas abscess, pyelonephritis  The patient is a 71 year old female who presents with back and abdominal pain.  This  started after she was bending down and came on rather acutely.  Her pain is in the right low back and radiates around to the left abdomen.  She has also had some diarrhea chills and nausea.  Given that pain came on with bending and came on so acutely my suspicion is that this is musculoskeletal however this would not really explain her abdominal tenderness or other GI symptoms given her tenderness on exam and her age do feel that she requires additional imaging to rule out intra-abdominal process.  Patient's blood pressure was also borderline on arrival and she was mildly tachycardic.  She was given IV fluids, Manus Gunning.  Labs notable for mild leukocytosis to 12.  Urine not consistent with infection.  CT abdomen pelvis not showing any acute findings.  Patient felt improved after morphine.  Suspect that this is musculoskeletal back pain.  Recommended Tylenol Motrin and prescribed Lidoderm patch.  Recommended PCP follow-up if symptoms not improving.       FINAL CLINICAL IMPRESSION(S) / ED DIAGNOSES   Final diagnoses:  Acute left-sided low back pain without sciatica     Rx / DC Orders   ED Discharge Orders          Ordered    lidocaine (LIDODERM) 5 %  Every 24 hours        09/28/21 1436             Note:  This document was prepared using Dragon voice recognition software and may include unintentional dictation errors.   Rada Hay, MD 09/28/21 1538

## 2021-09-28 NOTE — Discharge Instructions (Signed)
Your blood work urine sample and CAT scan were all reassuring.  I suspect that your pain is musculoskeletal related to a back strain.  Ibuprofen and Tylenol for the pain as well as the Lidoderm patch.

## 2021-09-28 NOTE — ED Triage Notes (Signed)
Pt presents to ED with /co of ABD pain, nausea and chills.

## 2021-12-01 ENCOUNTER — Ambulatory Visit (INDEPENDENT_AMBULATORY_CARE_PROVIDER_SITE_OTHER): Payer: Medicare Other | Admitting: Family Medicine

## 2021-12-01 ENCOUNTER — Encounter: Payer: Self-pay | Admitting: Family Medicine

## 2021-12-01 VITALS — BP 110/78 | HR 86 | Temp 99.2°F | Ht 67.0 in | Wt 176.0 lb

## 2021-12-01 DIAGNOSIS — J029 Acute pharyngitis, unspecified: Secondary | ICD-10-CM | POA: Diagnosis not present

## 2021-12-01 LAB — POCT RAPID STREP A (OFFICE): Rapid Strep A Screen: NEGATIVE

## 2021-12-01 MED ORDER — PROMETHAZINE-DM 6.25-15 MG/5ML PO SYRP: 5.0000 mL | ORAL_SOLUTION | Freq: Four times a day (QID) | ORAL | 0 refills | Status: DC | PRN

## 2021-12-01 NOTE — Assessment & Plan Note (Signed)
2-day history of throat pain, initial headache that resolved with ibuprofen, ear pain, nonproductive cough.  Symptoms are worst at night, denies fevers or chills and treatments beyond ibuprofen, Chloraseptic spray and lozenges.  Examination with patient speaking in full and clear sentences, intermittently interrupted by dry cough, lung fields are clear throughout without wheezes, rales, rhonchi, tympanic membranes and canals are benign bilaterally, there is a left cervical chain minimally tender lymph node, oropharynx with erythema, no exudate, mildly erythematous turbinates, mild tenderness about the maxillary regions bilaterally (patient states that this is baseline).  Clinical history and findings are most consistent with postnasal drip in the setting of rhinitis, etiology may be viral, allergic, etc.  A rapid strep test was done which was negative.  Given the duration of symptoms, findings today, plan for course of Flonase, Mucinex, as needed antitussive x7 days, if still symptomatic next week, antibiotics to be considered.

## 2021-12-01 NOTE — Progress Notes (Signed)
     Primary Care / Sports Medicine Office Visit  Patient Information:  Patient ID: Taylor Marshall, female DOB: 1950-07-01 Age: 71 y.o. MRN: [id number]   Taylor Marshall is a pleasant 71 y.o. female presenting with the following:  Chief Complaint  Patient presents with   Sore Throat    Headache, ear pain, cough for 2 days    Vitals:   12/01/21 1323  BP: 110/78  Pulse: 86  Temp: 99.2 F (37.3 C)  SpO2: 97%   Vitals:   12/01/21 1323  Weight: 176 lb (79.8 kg)  Height: 5\' 7"  (1.702 m)   Body mass index is 27.57 kg/m.  No results found.   Independent interpretation of notes and tests performed by another provider:   None  Procedures performed:   None  Pertinent History, Exam, Impression, and Recommendations:   Problem List Items Addressed This Visit       Respiratory   Pharyngitis - Primary    2-day history of throat pain, initial headache that resolved with ibuprofen, ear pain, nonproductive cough.  Symptoms are worst at night, denies fevers or chills and treatments beyond ibuprofen, Chloraseptic spray and lozenges.  Examination with patient speaking in full and clear sentences, intermittently interrupted by dry cough, lung fields are clear throughout without wheezes, rales, rhonchi, tympanic membranes and canals are benign bilaterally, there is a left cervical chain minimally tender lymph node, oropharynx with erythema, no exudate, mildly erythematous turbinates, mild tenderness about the maxillary regions bilaterally (patient states that this is baseline).  Clinical history and findings are most consistent with postnasal drip in the setting of rhinitis, etiology may be viral, allergic, etc.  A rapid strep test was done which was negative.  Given the duration of symptoms, findings today, plan for course of Flonase, Mucinex, as needed antitussive x7 days, if still symptomatic next week, antibiotics to be considered.      Relevant Medications    promethazine-dextromethorphan (PROMETHAZINE-DM) 6.25-15 MG/5ML syrup   Other Relevant Orders   POCT rapid strep A (Completed)     Orders & Medications Meds ordered this encounter  Medications   promethazine-dextromethorphan (PROMETHAZINE-DM) 6.25-15 MG/5ML syrup    Sig: Take 5 mLs by mouth 4 (four) times daily as needed for cough.    Dispense:  118 mL    Refill:  0   Orders Placed This Encounter  Procedures   POCT rapid strep A     Return if symptoms worsen or fail to improve.     03-08-1990, MD   Primary Care Sports Medicine [facility]

## 2021-12-01 NOTE — Patient Instructions (Signed)
-   Start Flonase, (fluticasone propionate), 2 sprays each nostril daily x7 days then as needed - Start Mucinex (guaifenesin) twice daily x7 days then as needed - Can use prescription cough medicine as needed - Contact our office for any persistent symptoms without improvement next week - Follow-up per routine with Dr. Yetta Barre

## 2021-12-04 ENCOUNTER — Ambulatory Visit: Payer: Self-pay

## 2021-12-04 ENCOUNTER — Other Ambulatory Visit: Payer: Self-pay | Admitting: Family Medicine

## 2021-12-04 MED ORDER — AZITHROMYCIN 250 MG PO TABS: ORAL_TABLET | ORAL | 0 refills | Status: AC

## 2021-12-04 NOTE — Telephone Encounter (Signed)
Please let patient know that per our visit from last week, if she was not getting better the next step is antibiotics.  I have sent a zpak to her pharmacy, take for full course. She is to continue with the initial treatments as well (flonase, mucinex, etc) while on antibiotics.

## 2021-12-04 NOTE — Telephone Encounter (Signed)
  Chief Complaint: sore throat  Symptoms: sore throat gotten worse and having stabbing sharp pains, difficulty painful to swallow  Frequency: 1 week but gotten different  Pertinent Negatives: Patient denies runny nose or fever  Disposition: [] ED /[] Urgent Care (no appt availability in office) / [] Appointment(In office/virtual)/ []  [place] [facility]/ [] Home Care/ [] Refused Recommended Disposition /[] [place] [facility]/ [x]  Follow-up with PCP Additional Notes: pt had OV on 12/01/21 with Dr. for sore throat. Strep was negative. Pt states she has tried everything recommended and nothing helping or getting better. Pt didn't want to come back in to be re-seen so advised I would send message back and see what recommendations could be given. Advised nurse would give CB to FU. Pt verbalized understanding.   Reason for Disposition  SEVERE (e.g., excruciating) throat pain  Answer Assessment - Initial Assessment Questions 1. ONSET: "When did the throat start hurting?" (Hours or days ago)      Almost 1 week  2. SEVERITY: "How bad is the sore throat?" (Scale 1-10; mild, moderate or severe)   - MILD (1-3):  Doesn't interfere with eating or normal activities.   - MODERATE (4-7): Interferes with eating some solids and normal activities.   - SEVERE (8-10):  Excruciating pain, interferes with most normal activities.   - SEVERE WITH DYSPHAGIA (10): Can't swallow liquids, drooling.     Moderate  4.  VIRAL SYMPTOMS: "Are there any symptoms of a cold, such as a runny nose, cough, hoarse voice or red eyes?"      Cough got better  5. FEVER: "Do you have a fever?" If Yes, ask: "What is your temperature, how was it measured, and when did it start?"     no 6. PUS ON THE TONSILS: "Is there pus on the tonsils in the back of your throat?"     Hadn't looked 7. OTHER SYMPTOMS: "Do you have any other symptoms?" (e.g., difficulty breathing, headache, rash)     Difficulty swallowing  Protocols used: Sore  Throat-A-AH

## 2021-12-07 NOTE — Telephone Encounter (Signed)
Called and left VM informing pt of abx. Told her to call with any questions if needed.  Deniss Wormley

## 2022-01-18 DIAGNOSIS — S61310A Laceration without foreign body of right index finger with damage to nail, initial encounter: Secondary | ICD-10-CM | POA: Diagnosis not present

## 2022-01-18 DIAGNOSIS — S61210A Laceration without foreign body of right index finger without damage to nail, initial encounter: Secondary | ICD-10-CM | POA: Diagnosis not present

## 2022-01-19 ENCOUNTER — Encounter: Payer: Medicare Other | Admitting: Family Medicine

## 2022-01-20 ENCOUNTER — Telehealth: Payer: Self-pay

## 2022-01-20 NOTE — Telephone Encounter (Signed)
Transition Care Management Follow-up Telephone Call Date of discharge and from where: 01/15/2024[facility] How have you been since you were released from the hospital? "Lots of pain" Any questions or concerns? No  Items Reviewed: Did the pt receive and understand the discharge instructions provided? Yes  Medications obtained and verified? No  Other? No  Any new allergies since your discharge? No  Dietary orders reviewed? Yes Do you have support at home? Yes   Home Care and Equipment/Supplies: none  Functional Questionnaire: (I = Independent and D = Dependent) ADLs: I  Bathing/Dressing- I  Meal Prep- I  Eating- I  Maintaining continence- I  Transferring/Ambulation- I  Managing Meds- I  Follow up appointments reviewed:  PCP Hospital f/u appt confirmed?  None needed, needs meds refilled and will schedule that.   [facility] f/u appt confirmed?  None needed   Are transportation arrangements needed? No  If their condition worsens, is the pt aware to call PCP or go to the Emergency Dept.? Yes Was the patient provided with contact information for the PCP's office or ED? Yes Was to pt encouraged to call back with questions or concerns? Yes

## 2022-02-02 ENCOUNTER — Telehealth: Payer: Self-pay

## 2022-02-02 ENCOUNTER — Encounter: Payer: Self-pay | Admitting: Family Medicine

## 2022-02-02 ENCOUNTER — Ambulatory Visit (INDEPENDENT_AMBULATORY_CARE_PROVIDER_SITE_OTHER): Payer: Medicare Other | Admitting: Family Medicine

## 2022-02-02 VITALS — BP 120/78 | HR 98 | Ht 67.0 in | Wt 176.0 lb

## 2022-02-02 DIAGNOSIS — R928 Other abnormal and inconclusive findings on diagnostic imaging of breast: Secondary | ICD-10-CM

## 2022-02-02 DIAGNOSIS — R7303 Prediabetes: Secondary | ICD-10-CM

## 2022-02-02 DIAGNOSIS — K760 Fatty (change of) liver, not elsewhere classified: Secondary | ICD-10-CM | POA: Diagnosis not present

## 2022-02-02 NOTE — Telephone Encounter (Signed)
Called pt after speaking with Denice Paradise by chat. She is to call GI today and schedule appt for follow up with Maudie Mercury in June. I offered telephone number and she stated she has it. I also passed over the message that she needs to get second Hep B vaccine per Dawson Bills.

## 2022-02-02 NOTE — Progress Notes (Signed)
Date:  02/02/2022   Name:  Taylor Marshall   DOB:  05/25/1950   MRN:  [id number]   Chief Complaint: breast exam  Patient is a 72 year old female who presents for a breast exam. The patient reports the following problems: abn mammogram. Health maintenance has been reviewed hep c      Lab Results  Component Value Date   NA 137 09/28/2021   K 4.0 09/28/2021   CO2 22 09/28/2021   GLUCOSE 141 (H) 09/28/2021   BUN 12 09/28/2021   CREATININE 0.75 09/28/2021   CALCIUM 9.6 09/28/2021   GFRNONAA >60 09/28/2021   No results found for: "CHOL", "HDL", "LDLCALC", "LDLDIRECT", "TRIG", "CHOLHDL" No results found for: "TSH" No results found for: "HGBA1C" Lab Results  Component Value Date   WBC 12.5 (H) 09/28/2021   HGB 14.0 09/28/2021   HCT 43.9 09/28/2021   MCV 82.5 09/28/2021   PLT 274 09/28/2021   Lab Results  Component Value Date   ALT 13 09/28/2021   AST 20 09/28/2021   ALKPHOS 88 09/28/2021   BILITOT 0.9 09/28/2021   No results found for: "25OHVITD2", "25OHVITD3", "VD25OH"   Review of Systems  Constitutional:  Negative for fatigue and unexpected weight change.  HENT:  Negative for trouble swallowing.   Eyes:  Negative for visual disturbance.  Respiratory:  Negative for chest tightness, shortness of breath and wheezing.   Cardiovascular:  Negative for chest pain and palpitations.  Gastrointestinal:  Negative for abdominal pain, anal bleeding, constipation and diarrhea.  Endocrine: Negative for cold intolerance, heat intolerance, polydipsia and polyuria.  Genitourinary:  Negative for difficulty urinating, hematuria and vaginal bleeding.  Hematological:  Negative for adenopathy. Bruises/bleeds easily.    Patient Active Problem List   Diagnosis Date Noted   Pharyngitis 12/01/2021   Hiatal hernia 03/29/2021   H/O adenomatous polyp of colon 03/29/2021   Hepatic steatosis 03/24/2021   Nicotine dependence due to vaping non-tobacco product 02/15/2021   Gallbladder  sludge 02/10/2021   Bright red rectal bleeding 02/10/2021   Left flank pain 02/07/2013    Allergies  Allergen Reactions   Morphine And Related Nausea Only   Buprenorphine Hcl Nausea Only    Past Surgical History:  Procedure Laterality Date   BREAST EXCISIONAL BIOPSY Right    COLONOSCOPY  2006?   Dr Nicolasa Ducking   VAGINAL HYSTERECTOMY      Social History   Tobacco Use   Smoking status: Every Day    Types: E-cigarettes   Smokeless tobacco: Never  Vaping Use   Vaping Use: Never used  Substance Use Topics   Alcohol use: No    Alcohol/week: 0.0 standard drinks of alcohol   Drug use: No     Medication list has been reviewed and updated.  Current Meds  Medication Sig   omeprazole (PRILOSEC) 20 MG capsule Take 20 mg by mouth daily.       02/02/2022    9:42 AM 06/15/2021   10:58 AM 02/05/2021    1:37 PM 01/01/2020    2:15 PM  GAD 7 : Generalized Anxiety Score  Nervous, Anxious, on Edge 0 0 2 0  Control/stop worrying 0 0 2 0  Worry too much - different things 0 0 3 0  Trouble relaxing 0 0 0 0  Restless 0 0 0 0  Easily annoyed or irritable 0 0 0 0  Afraid - awful might happen 0 0 2 0  Total GAD 7 Score 0 0 9 0  Anxiety Difficulty Not difficult at all Not difficult at all Somewhat difficult        02/02/2022    9:42 AM 06/15/2021   10:58 AM 05/20/2021    1:38 PM  Depression screen PHQ 2/9  Decreased Interest 0 0 0  Down, Depressed, Hopeless 0 0 0  PHQ - 2 Score 0 0 0  Altered sleeping 0 0   Tired, decreased energy 0 0   Change in appetite 0 0   Feeling bad or failure about yourself  0 0   Trouble concentrating 0 0   Moving slowly or fidgety/restless 0 0   Suicidal thoughts 0 0   PHQ-9 Score 0 0   Difficult doing work/chores Not difficult at all Not difficult at all     BP Readings from Last 3 Encounters:  02/02/22 120/78  12/01/21 110/78  09/28/21 118/78    Physical Exam Vitals and nursing note reviewed. Exam conducted with a chaperone present.   Constitutional:      General: She is not in acute distress.    Appearance: She is not diaphoretic.  HENT:     Head: Normocephalic and atraumatic.     Right Ear: External ear normal.     Left Ear: External ear normal.     Nose: Nose normal.  Eyes:     General:        Right eye: No discharge.        Left eye: No discharge.     Conjunctiva/sclera: Conjunctivae normal.     Pupils: Pupils are equal, round, and reactive to light.  Neck:     Thyroid: No thyromegaly.     Vascular: No JVD.  Cardiovascular:     Rate and Rhythm: Normal rate and regular rhythm.     Heart sounds: Normal heart sounds. No murmur heard.    No friction rub. No gallop.  Pulmonary:     Effort: Pulmonary effort is normal.     Breath sounds: Normal breath sounds.  Chest:  Breasts:    Right: No swelling, bleeding, inverted nipple, mass, nipple discharge, skin change or tenderness.     Left: No swelling, bleeding, inverted nipple, mass, nipple discharge, skin change or tenderness.  Abdominal:     General: Bowel sounds are normal.     Palpations: Abdomen is soft. There is no mass.     Tenderness: There is no abdominal tenderness. There is no guarding.  Musculoskeletal:        General: Normal range of motion.     Cervical back: Normal range of motion and neck supple.  Lymphadenopathy:     Cervical: No cervical adenopathy.     Upper Body:     Right upper body: No supraclavicular or axillary adenopathy.     Left upper body: No supraclavicular or axillary adenopathy.  Skin:    General: Skin is warm and dry.  Neurological:     Mental Status: She is alert.     Deep Tendon Reflexes: Reflexes are normal and symmetric.     Wt Readings from Last 3 Encounters:  02/02/22 176 lb (79.8 kg)  12/01/21 176 lb (79.8 kg)  06/15/21 182 lb (82.6 kg)    BP 120/78   Pulse 98   Ht 5\' 7"  (1.702 m)   Wt 176 lb (79.8 kg)   SpO2 98%   BMI 27.57 kg/m   Assessment and Plan:  1. Hepatic steatosis Chronic.  Relatively  stable.  Review of GI monitoring notes that patient  received Twinrix and is likely why the hep A and hep B were in acceptable range.  Did not notice for hepatitis C antibody had been done and health maintenance is calling for this so I proceeded to obtain this.  Also noted that there was any elastography that was ordered last year which was not done and I did not know if something did happen with the insurance but passed information on to Dr. Jerelene Redden and discussed with patient. - Hepatitis C Antibody  2. Prediabetes Patient has history of prediabetes which she is aware.  Weight loss is encouraged and patient has been given information on limiting carbohydrates and improving diabetic nutritional outlook.  3. Abnormal mammogram Discussed with patient exam was done with no palpable abnormalities noted.  Will proceed with further evaluation with right breast ultrasound including axilla and diagnostic breast tomography on the right as well.. - US BREAST LTD UNI RIGHT INC AXILLA - MM DIAG BREAST TOMO UNI RIGHT    Otilio Miu, MD

## 2022-02-02 NOTE — Patient Instructions (Signed)

## 2022-02-03 LAB — HEPATITIS C ANTIBODY: Hep C Virus Ab: NONREACTIVE

## 2022-02-04 ENCOUNTER — Ambulatory Visit
Admission: RE | Admit: 2022-02-04 | Discharge: 2022-02-04 | Disposition: A | Discharge status: Home / Self Care | Payer: Medicare Other | Source: Ambulatory Visit | Attending: Family Medicine | Admitting: Family Medicine

## 2022-02-04 ENCOUNTER — Ambulatory Visit: Payer: Medicare Other

## 2022-02-04 DIAGNOSIS — R928 Other abnormal and inconclusive findings on diagnostic imaging of breast: Secondary | ICD-10-CM | POA: Diagnosis not present

## 2022-02-04 DIAGNOSIS — R922 Inconclusive mammogram: Secondary | ICD-10-CM | POA: Diagnosis not present

## 2022-04-30 ENCOUNTER — Telehealth: Payer: Self-pay | Admitting: Family Medicine

## 2022-04-30 NOTE — Telephone Encounter (Signed)
Contacted [name] to schedule their annual wellness visit. Patient declined to schedule AWV at this time. AWV SCHEDULED WITH [employer] 06/03/22  Verlee Rossetti; Care Guide Ambulatory Clinical Support [place] [facility] Direct Dial: [phone]

## 2022-07-13 ENCOUNTER — Ambulatory Visit (INDEPENDENT_AMBULATORY_CARE_PROVIDER_SITE_OTHER): Payer: Medicare Other | Admitting: Family Medicine

## 2022-07-13 ENCOUNTER — Encounter: Payer: Self-pay | Admitting: Family Medicine

## 2022-07-13 VITALS — BP 120/76 | HR 94 | Ht 67.0 in | Wt 174.0 lb

## 2022-07-13 DIAGNOSIS — R252 Cramp and spasm: Secondary | ICD-10-CM

## 2022-07-13 NOTE — Progress Notes (Signed)
Date:  07/13/2022   Name:  Taylor Marshall   DOB:  [birth year]   MRN:  [id number]   Chief Complaint: Leg Pain (Bilateral leg pain, cramping)  Leg Pain  The incident occurred more than 1 week ago. There was no injury mechanism. The pain is present in the left leg and right leg (lower ler). The patient is experiencing no pain. The pain has been Constant since onset. Pertinent negatives include no muscle weakness, numbness or tingling. Treatments tried: hydration.    Lab Results  Component Value Date   NA 137 09/28/2021   K 4.0 09/28/2021   CO2 22 09/28/2021   GLUCOSE 141 (H) 09/28/2021   BUN 12 09/28/2021   CREATININE 0.75 09/28/2021   CALCIUM 9.6 09/28/2021   GFRNONAA >60 09/28/2021   No results found for: "CHOL", "HDL", "LDLCALC", "LDLDIRECT", "TRIG", "CHOLHDL" No results found for: "TSH" No results found for: "HGBA1C" Lab Results  Component Value Date   WBC 12.5 (H) 09/28/2021   HGB 14.0 09/28/2021   HCT 43.9 09/28/2021   MCV 82.5 09/28/2021   PLT 274 09/28/2021   Lab Results  Component Value Date   ALT 13 09/28/2021   AST 20 09/28/2021   ALKPHOS 88 09/28/2021   BILITOT 0.9 09/28/2021   No results found for: "25OHVITD2", "25OHVITD3", "VD25OH"   Review of Systems  Constitutional:  Negative for fever and unexpected weight change.  Respiratory:  Negative for shortness of breath and wheezing.   Cardiovascular:  Negative for chest pain and palpitations.  Gastrointestinal:  Negative for abdominal distention.  Endocrine: Negative for polydipsia and polyuria.  Neurological:  Negative for tingling and numbness.    Patient Active Problem List   Diagnosis Date Noted   Pharyngitis 12/01/2021   Hiatal hernia 03/29/2021   H/O adenomatous polyp of colon 03/29/2021   Hepatic steatosis 03/24/2021   Nicotine dependence due to vaping non-tobacco product 02/15/2021   Gallbladder sludge 02/10/2021   Bright red rectal bleeding 02/10/2021   Left flank pain 02/07/2013     Allergies  Allergen Reactions   Morphine And Codeine Nausea Only   Buprenorphine Hcl Nausea Only    Past Surgical History:  Procedure Laterality Date   BREAST EXCISIONAL BIOPSY Right    COLONOSCOPY  2006?   Dr Maryruth Bun   VAGINAL HYSTERECTOMY      Social History   Tobacco Use   Smoking status: Every Day    Types: E-cigarettes   Smokeless tobacco: Never  Vaping Use   Vaping Use: Never used  Substance Use Topics   Alcohol use: No    Alcohol/week: 0.0 standard drinks of alcohol   Drug use: No     Medication list has been reviewed and updated.  Current Meds  Medication Sig   omeprazole (PRILOSEC) 20 MG capsule Take 20 mg by mouth daily.       07/13/2022    3:33 PM 02/02/2022    9:42 AM 06/15/2021   10:58 AM 02/05/2021    1:37 PM  GAD 7 : Generalized Anxiety Score  Nervous, Anxious, on Edge 0 0 0 2  Control/stop worrying 0 0 0 2  Worry too much - different things 0 0 0 3  Trouble relaxing 0 0 0 0  Restless 0 0 0 0  Easily annoyed or irritable 0 0 0 0  Afraid - awful might happen 0 0 0 2  Total GAD 7 Score 0 0 0 9  Anxiety Difficulty Not difficult at all Not difficult  at all Not difficult at all Somewhat difficult       07/13/2022    3:33 PM 02/02/2022    9:42 AM 06/15/2021   10:58 AM  Depression screen PHQ 2/9  Decreased Interest 0 0 0  Down, Depressed, Hopeless 0 0 0  PHQ - 2 Score 0 0 0  Altered sleeping 0 0 0  Tired, decreased energy 0 0 0  Change in appetite 0 0 0  Feeling bad or failure about yourself  0 0 0  Trouble concentrating 0 0 0  Moving slowly or fidgety/restless 0 0 0  Suicidal thoughts 0 0 0  PHQ-9 Score 0 0 0  Difficult doing work/chores Not difficult at all Not difficult at all Not difficult at all    BP Readings from Last 3 Encounters:  07/13/22 120/76  02/02/22 120/78  12/01/21 110/78    Physical Exam Vitals and nursing note reviewed.  Cardiovascular:     Heart sounds: No murmur heard.    No friction rub. No gallop.   Pulmonary:     Breath sounds: No wheezing, rhonchi or rales.  Abdominal:     Tenderness: There is no abdominal tenderness.     Hernia: No hernia is present.     Wt Readings from Last 3 Encounters:  07/13/22 174 lb (78.9 kg)  02/02/22 176 lb (79.8 kg)  12/01/21 176 lb (79.8 kg)    BP 120/76   Pulse 94   Ht 5\' 7"  (1.702 m)   Wt 174 lb (78.9 kg)   SpO2 97%   BMI 27.25 kg/m   Assessment and Plan:  1. Nocturnal muscle cramps New onset.  Episodic.  Patient's been experiencing primarily at night in the calves and lower leg.  Relieved with stretching and putting the foot on the floor.  We discussed TR YP channels and that hydration electrolytes are generally not the problem.  But I have noticed that patient is dehydrated and have suggested that she starts hydrating on a regular basis during the heat concerns.  In the meantime I have suggested that she take a spoonful of mustard prior to bed to activate trip channels.   Elizabeth Sauer, MD

## 2022-07-21 ENCOUNTER — Other Ambulatory Visit: Payer: Self-pay | Admitting: Family Medicine

## 2022-07-21 DIAGNOSIS — Z1231 Encounter for screening mammogram for malignant neoplasm of breast: Secondary | ICD-10-CM

## 2022-07-21 DIAGNOSIS — N6489 Other specified disorders of breast: Secondary | ICD-10-CM

## 2022-07-26 ENCOUNTER — Ambulatory Visit (INDEPENDENT_AMBULATORY_CARE_PROVIDER_SITE_OTHER): Payer: Medicare Other | Admitting: Family Medicine

## 2022-07-26 ENCOUNTER — Encounter: Payer: Self-pay | Admitting: Family Medicine

## 2022-07-26 VITALS — BP 118/74 | HR 74 | Ht 67.0 in | Wt 171.0 lb

## 2022-07-26 DIAGNOSIS — J029 Acute pharyngitis, unspecified: Secondary | ICD-10-CM

## 2022-07-26 DIAGNOSIS — R0981 Nasal congestion: Secondary | ICD-10-CM

## 2022-07-26 LAB — POCT RAPID STREP A (OFFICE): Rapid Strep A Screen: NEGATIVE

## 2022-07-26 MED ORDER — TRIAMCINOLONE ACETONIDE 55 MCG/ACT NA AERO: 2.0000 | INHALATION_SPRAY | Freq: Every day | NASAL | 12 refills | Status: DC

## 2022-07-26 NOTE — Progress Notes (Signed)
Date:  07/26/2022   Name:  Taylor Marshall   DOB:  1950-11-14   MRN:  [id number]   Chief Complaint: Sore Throat (Off and on- not getting better)  Sore Throat  This is a new problem. The current episode started more than 1 month ago. The problem has been waxing and waning (intermitant for 6 months/more or less continuous for 3 weeks). The pain is worse on the right side. There has been no fever. The pain is moderate. Associated symptoms include ear pain, headaches, a hoarse voice, neck pain and vomiting. Pertinent negatives include no abdominal pain, congestion, coughing, diarrhea, drooling, ear discharge, plugged ear sensation, shortness of breath or trouble swallowing. Associated symptoms comments: Right ear pain. She has had no exposure to strep or mono. She has tried nothing for the symptoms.    Lab Results  Component Value Date   NA 137 09/28/2021   K 4.0 09/28/2021   CO2 22 09/28/2021   GLUCOSE 141 (H) 09/28/2021   BUN 12 09/28/2021   CREATININE 0.75 09/28/2021   CALCIUM 9.6 09/28/2021   GFRNONAA >60 09/28/2021   No results found for: "CHOL", "HDL", "LDLCALC", "LDLDIRECT", "TRIG", "CHOLHDL" No results found for: "TSH" No results found for: "HGBA1C" Lab Results  Component Value Date   WBC 12.5 (H) 09/28/2021   HGB 14.0 09/28/2021   HCT 43.9 09/28/2021   MCV 82.5 09/28/2021   PLT 274 09/28/2021   Lab Results  Component Value Date   ALT 13 09/28/2021   AST 20 09/28/2021   ALKPHOS 88 09/28/2021   BILITOT 0.9 09/28/2021   No results found for: "25OHVITD2", "25OHVITD3", "VD25OH"   Review of Systems  HENT:  Positive for ear pain, hoarse voice, rhinorrhea and sore throat. Negative for congestion, drooling, ear discharge, nosebleeds, postnasal drip, sinus pressure, sinus pain and trouble swallowing.        AM rhinorhea  Respiratory:  Negative for cough and shortness of breath.   Cardiovascular:  Negative for chest pain and palpitations.  Gastrointestinal:  Positive for  vomiting. Negative for abdominal pain and diarrhea.  Musculoskeletal:  Positive for neck pain.  Neurological:  Positive for headaches.    Patient Active Problem List   Diagnosis Date Noted   Pharyngitis 12/01/2021   Hiatal hernia 03/29/2021   H/O adenomatous polyp of colon 03/29/2021   Hepatic steatosis 03/24/2021   Nicotine dependence due to vaping non-tobacco product 02/15/2021   Gallbladder sludge 02/10/2021   Bright red rectal bleeding 02/10/2021   Left flank pain 02/07/2013    Allergies  Allergen Reactions   Morphine And Codeine Nausea Only   Buprenorphine Hcl Nausea Only    Past Surgical History:  Procedure Laterality Date   BREAST EXCISIONAL BIOPSY Right    COLONOSCOPY  2006?   Dr Maryruth Bun   VAGINAL HYSTERECTOMY      Social History   Tobacco Use   Smoking status: Every Day    Types: E-cigarettes   Smokeless tobacco: Never  Vaping Use   Vaping status: Never Used  Substance Use Topics   Alcohol use: No    Alcohol/week: 0.0 standard drinks of alcohol   Drug use: No     Medication list has been reviewed and updated.  Current Meds  Medication Sig   omeprazole (PRILOSEC) 20 MG capsule Take 20 mg by mouth daily.       07/26/2022    3:36 PM 07/13/2022    3:33 PM 02/02/2022    9:42 AM 06/15/2021  10:58 AM  GAD 7 : Generalized Anxiety Score  Nervous, Anxious, on Edge 0 0 0 0  Control/stop worrying 0 0 0 0  Worry too much - different things 0 0 0 0  Trouble relaxing 0 0 0 0  Restless 0 0 0 0  Easily annoyed or irritable 0 0 0 0  Afraid - awful might happen 0 0 0 0  Total GAD 7 Score 0 0 0 0  Anxiety Difficulty Not difficult at all Not difficult at all Not difficult at all Not difficult at all       07/26/2022    3:36 PM 07/13/2022    3:33 PM 02/02/2022    9:42 AM  Depression screen PHQ 2/9  Decreased Interest 0 0 0  Down, Depressed, Hopeless 0 0 0  PHQ - 2 Score 0 0 0  Altered sleeping 0 0 0  Tired, decreased energy 0 0 0  Change in appetite 0 0 0   Feeling bad or failure about yourself  0 0 0  Trouble concentrating 0 0 0  Moving slowly or fidgety/restless 0 0 0  Suicidal thoughts 0 0 0  PHQ-9 Score 0 0 0  Difficult doing work/chores Not difficult at all Not difficult at all Not difficult at all    BP Readings from Last 3 Encounters:  07/26/22 118/74  07/13/22 120/76  02/02/22 120/78    Physical Exam Vitals and nursing note reviewed.  HENT:     Head: Normocephalic.     Right Ear: Tympanic membrane and ear canal normal. No drainage.     Left Ear: Tympanic membrane and ear canal normal. No drainage.     Nose: No congestion or rhinorrhea.     Mouth/Throat:     Mouth: Mucous membranes are moist. No oral lesions.     Pharynx: Posterior oropharyngeal erythema present. No oropharyngeal exudate or uvula swelling.     Tonsils: No tonsillar exudate or tonsillar abscesses.  Eyes:     Pupils: Pupils are equal, round, and reactive to light.  Neck:     Thyroid: No thyromegaly.  Cardiovascular:     Rate and Rhythm: Normal rate and regular rhythm.     Heart sounds: Normal heart sounds. No murmur heard.    No friction rub. No gallop.  Abdominal:     Palpations: Abdomen is soft.     Tenderness: There is abdominal tenderness.     Comments: Mild tenderness bilateral upper quadrants  Musculoskeletal:     Cervical back: Neck supple.  Lymphadenopathy:     Cervical: No cervical adenopathy.  Neurological:     Mental Status: She is alert.     Wt Readings from Last 3 Encounters:  07/26/22 171 lb (77.6 kg)  07/13/22 174 lb (78.9 kg)  02/02/22 176 lb (79.8 kg)    BP 118/74   Pulse 74   Ht 5\' 7"  (1.702 m)   Wt 171 lb (77.6 kg)   SpO2 98%   BMI 26.78 kg/m   Assessment and Plan:  1. Sore throat Chronic over 6 months.  Intermittent.  Generally mild in nature until the last 3 weeks in which it became more noticeable in intensity and persistent.  Patient has concerns that there may be something other than allergies and infectious  of concern (particularly of oncology nature) and would like to have a formal ENT evaluation.  Patient has discontinued smoking which I have complemented her on but have replaced it with vaping.  Strep test is  negative.  As noted below because of the recurrent sinus nasal congestion and she used to be on Flonase I have presumed nasal steroid but and an aqueous form Nasacort as directed and saline lavage.  Proceed with ear nose and throat referral for formal evaluation. - Ambulatory referral to ENT - POCT rapid strep A  2. Nasal sinus congestion Recurrent.  Persistent/intermittent.  Likely of an allergic but it may be open irritant nature due to her use of vaping.  I would suggest that she would be formally evaluated and to reinitiate her nasal steroid. - triamcinolone (NASACORT) 55 MCG/ACT AERO nasal inhaler; Place 2 sprays into the nose daily.  Dispense: 1 each; Refill: 12    Elizabeth Sauer, MD

## 2022-08-10 DIAGNOSIS — H6981 Other specified disorders of Eustachian tube, right ear: Secondary | ICD-10-CM | POA: Diagnosis not present

## 2022-08-10 DIAGNOSIS — R490 Dysphonia: Secondary | ICD-10-CM | POA: Diagnosis not present

## 2022-08-10 DIAGNOSIS — J3489 Other specified disorders of nose and nasal sinuses: Secondary | ICD-10-CM | POA: Diagnosis not present

## 2022-08-10 DIAGNOSIS — J040 Acute laryngitis: Secondary | ICD-10-CM | POA: Diagnosis not present

## 2022-08-10 DIAGNOSIS — J342 Deviated nasal septum: Secondary | ICD-10-CM | POA: Diagnosis not present

## 2022-08-10 DIAGNOSIS — K219 Gastro-esophageal reflux disease without esophagitis: Secondary | ICD-10-CM | POA: Diagnosis not present

## 2022-08-10 DIAGNOSIS — R070 Pain in throat: Secondary | ICD-10-CM | POA: Diagnosis not present

## 2022-08-10 DIAGNOSIS — J329 Chronic sinusitis, unspecified: Secondary | ICD-10-CM | POA: Diagnosis not present

## 2022-08-11 ENCOUNTER — Ambulatory Visit (INDEPENDENT_AMBULATORY_CARE_PROVIDER_SITE_OTHER): Payer: Medicare Other

## 2022-08-11 ENCOUNTER — Other Ambulatory Visit: Payer: Self-pay

## 2022-08-11 VITALS — Ht 67.0 in | Wt 171.0 lb

## 2022-08-11 DIAGNOSIS — Z0000 Encounter for general adult medical examination without abnormal findings: Secondary | ICD-10-CM | POA: Diagnosis not present

## 2022-08-11 NOTE — Progress Notes (Signed)
Called and scheduled 8/9 @ 240 in [place] for [employer]	[place]

## 2022-08-11 NOTE — Patient Instructions (Signed)
[name] , Thank you for taking time to come for your Medicare Wellness Visit. I appreciate your ongoing commitment to your health goals. Please review the following plan we discussed and let me know if I can assist you in the future.   These are the goals we discussed:  Goals       Weight (lb) < 200 lb (90.7 kg) (pt-stated)      Stop the vape and lose some weight        This is a list of the screening recommended for you and due dates:  Health Maintenance  Topic Date Due   COVID-19 Vaccine (5 - 2023-24 season) 08/27/2022*   Zoster (Shingles) Vaccine (1 of 2) 10/13/2022*   Pneumonia Vaccine (1 of 2 - PCV) 02/03/2023*   Flu Shot  04/04/2023*   Mammogram  06/26/2023   Medicare Annual Wellness Visit  08/11/2023   Colon Cancer Screening  03/12/2028   DTaP/Tdap/Td vaccine (2 - Td or Tdap) 04/16/2031   DEXA scan (bone density measurement)  Completed   Hepatitis C Screening  Completed   HPV Vaccine  Aged Out  *Topic was postponed. The date shown is not the original due date.   Health Maintenance After Age 84 After age 46, you are at a higher risk for certain long-term diseases and infections as well as injuries from falls. Falls are a major cause of broken bones and head injuries in people who are older than age 13. Getting regular preventive care can help to keep you healthy and well. Preventive care includes getting regular testing and making lifestyle changes as recommended by your health care provider. Talk with your health care provider about: Which screenings and tests you should have. A screening is a test that checks for a disease when you have no symptoms. A diet and exercise plan that is right for you. What should I know about screenings and tests to prevent falls? Screening and testing are the best ways to find a health problem early. Early diagnosis and treatment give you the best chance of managing medical conditions that are common after age 66. Certain conditions and  lifestyle choices may make you more likely to have a fall. Your health care provider may recommend: Regular vision checks. Poor vision and conditions such as cataracts can make you more likely to have a fall. If you wear glasses, make sure to get your prescription updated if your vision changes. Medicine review. Work with your health care provider to regularly review all of the medicines you are taking, including over-the-counter medicines. Ask your health care provider about any side effects that may make you more likely to have a fall. Tell your health care provider if any medicines that you take make you feel dizzy or sleepy. Strength and balance checks. Your health care provider may recommend certain tests to check your strength and balance while standing, walking, or changing positions. Foot health exam. Foot pain and numbness, as well as not wearing proper footwear, can make you more likely to have a fall. Screenings, including: Osteoporosis screening. Osteoporosis is a condition that causes the bones to get weaker and break more easily. Blood pressure screening. Blood pressure changes and medicines to control blood pressure can make you feel dizzy. Depression screening. You may be more likely to have a fall if you have a fear of falling, feel depressed, or feel unable to do activities that you used to do. Alcohol use screening. Using too much alcohol can affect your  balance and may make you more likely to have a fall. Follow these instructions at home: Lifestyle Do not drink alcohol if: Your health care provider tells you not to drink. If you drink alcohol: Limit how much you have to: 0-1 drink a day for women. 0-2 drinks a day for men. Know how much alcohol is in your drink. In the [place] one drink equals one 12 oz bottle of beer (355 mL), one 5 oz glass of wine (148 mL), or one 1 oz glass of hard liquor (44 mL). Do not use any products that contain nicotine or tobacco. These products  include cigarettes, chewing tobacco, and vaping devices, such as e-cigarettes. If you need help quitting, ask your health care provider. Activity  Follow a regular exercise program to stay fit. This will help you maintain your balance. Ask your health care provider what types of exercise are appropriate for you. If you need a cane or walker, use it as recommended by your health care provider. Wear supportive shoes that have nonskid soles. Safety  Remove any tripping hazards, such as rugs, cords, and clutter. Install safety equipment such as grab bars in bathrooms and safety rails on stairs. Keep rooms and walkways well-lit. General instructions Talk with your health care provider about your risks for falling. Tell your health care provider if: You fall. Be sure to tell your health care provider about all falls, even ones that seem minor. You feel dizzy, tiredness (fatigue), or off-balance. Take over-the-counter and prescription medicines only as told by your health care provider. These include supplements. Eat a healthy diet and maintain a healthy weight. A healthy diet includes low-fat dairy products, low-fat (lean) meats, and fiber from whole grains, beans, and lots of fruits and vegetables. Stay current with your vaccines. Schedule regular health, dental, and eye exams. Summary Having a healthy lifestyle and getting preventive care can help to protect your health and wellness after age 13. Screening and testing are the best way to find a health problem early and help you avoid having a fall. Early diagnosis and treatment give you the best chance for managing medical conditions that are more common for people who are older than age 20. Falls are a major cause of broken bones and head injuries in people who are older than age 35. Take precautions to prevent a fall at home. Work with your health care provider to learn what changes you can make to improve your health and wellness and to prevent  falls. This information is not intended to replace advice given to you by your health care provider. Make sure you discuss any questions you have with your health care provider. Document Revised: 05/12/2020 Document Reviewed: 05/12/2020 Elsevier Patient Education  2024 [employer].

## 2022-08-11 NOTE — Progress Notes (Signed)
Subjective:   Taylor Marshall is a 72 y.o. female who presents for Medicare Annual (Subsequent) preventive examination.  Visit Complete: Virtual  I connected with  Taylor Marshall on 08/11/22 by a audio enabled telemedicine application and verified that I am speaking with the correct person using two identifiers.  Patient Location: Home  Provider Location: Office/Clinic  I discussed the limitations of evaluation and management by telemedicine. The patient expressed understanding and agreed to proceed.  Cardiac Risk Factors include: advanced age (>52men, >31 women);smoking/ tobacco exposure     Objective:    Today's Vitals   08/11/22 0953  Weight: 171 lb (77.6 kg)  Height: 5\' 7"  (1.702 m)   Body mass index is 26.78 kg/m.     08/11/2022   10:00 AM 05/20/2021    1:40 PM 11/15/2014    3:40 PM  Advanced Directives  Does Patient Have a Medical Advance Directive? No Yes No  Type of Soil scientist of Healthcare Power of Attorney in Chart?  No - copy requested   Would patient like information on creating a medical advance directive? No - Patient declined  No - patient declined information    Current Medications (verified) Outpatient Encounter Medications as of 08/11/2022  Medication Sig   omeprazole (PRILOSEC) 20 MG capsule Take 20 mg by mouth daily.   triamcinolone (NASACORT) 55 MCG/ACT AERO nasal inhaler Place 2 sprays into the nose daily.   No facility-administered encounter medications on file as of 08/11/2022.    Allergies (verified) Morphine and codeine and Buprenorphine hcl   History: Past Medical History:  Diagnosis Date   GERD (gastroesophageal reflux disease)    Past Surgical History:  Procedure Laterality Date   BREAST EXCISIONAL BIOPSY Right    COLONOSCOPY  2006?   Dr Maryruth Bun   VAGINAL HYSTERECTOMY     Family History  Problem Relation Age of Onset   Cancer Mother    Hypertension Mother    Diabetes Mother     Hypertension Father    Heart failure Father    Heart disease Father    Atrial fibrillation Father    Breast cancer Neg Hx    Social History   Socioeconomic History   Marital status: Single    Spouse name: Not on file   Number of children: Not on file   Years of education: Not on file   Highest education level: Not on file  Occupational History   Not on file  Tobacco Use   Smoking status: Every Day    Types: E-cigarettes   Smokeless tobacco: Never  Vaping Use   Vaping status: Never Used  Substance and Sexual Activity   Alcohol use: No    Alcohol/week: 0.0 standard drinks of alcohol   Drug use: No   Sexual activity: Not Currently  Other Topics Concern   Not on file  Social History Narrative   Not on file   Social Determinants of Health   Financial Resource Strain: Low Risk  (08/11/2022)   Overall Financial Resource Strain (CARDIA)    Difficulty of Paying Living Expenses: Not hard at all  Food Insecurity: No Food Insecurity (08/11/2022)   Hunger Vital Sign    Worried About Running Out of Food in the Last Year: Never true    Ran Out of Food in the Last Year: Never true  Transportation Needs: No Transportation Needs (08/11/2022)   PRAPARE - Administrator, Civil Service (Medical): No  Lack of Transportation (Non-Medical): No  Physical Activity: Insufficiently Active (08/11/2022)   Exercise Vital Sign    Days of Exercise per Week: 2 days    Minutes of Exercise per Session: 20 min  Stress: No Stress Concern Present (08/11/2022)   [employer] of Occupational Health - Occupational Stress Questionnaire    Feeling of Stress : Only a little  Social Connections: Moderately Isolated (08/11/2022)   Social Connection and Isolation Panel [NHANES]    Frequency of Communication with Friends and Family: More than three times a week    Frequency of Social Gatherings with Friends and Family: More than three times a week    Attends Religious Services: Never    Loss adjuster, chartered or Organizations: No    Attends Engineer, structural: Never    Marital Status: Living with partner    Tobacco Counseling Ready to quit: No Counseling given: Yes   Clinical Intake:     Pain : No/denies pain     BMI - recorded: 26.78 Nutritional Status: BMI 25 -29 Overweight Nutritional Risks: None Diabetes: No  How often do you need to have someone help you when you read instructions, pamphlets, or other written materials from your doctor or pharmacy?: 1 - Never  Interpreter Needed?: No  Information entered by :: Arthur Holms   Activities of Daily Living    08/11/2022    9:55 AM  In your present state of health, do you have any difficulty performing the following activities:  Hearing? 0  Vision? 0  Difficulty concentrating or making decisions? 0  Walking or climbing stairs? 0  Dressing or bathing? 0  Doing errands, shopping? 0  Preparing Food and eating ? N  Using the Toilet? N  In the past six months, have you accidently leaked urine? N  Do you have problems with loss of bowel control? N  Managing your Medications? N  Managing your Finances? N  Housekeeping or managing your Housekeeping? N    Patient Care Team: Duanne Limerick, MD as PCP - General (Family Medicine)  Indicate any recent Medical Services you may have received from other than [facility] providers in the past year (date may be approximate).     Assessment:   This is a routine wellness examination for Raedean.  Hearing/Vision screen Hearing Screening - Comments:: Hearing well over the phone Vision Screening - Comments:: Wears readers  Dietary issues and exercise activities discussed:     Goals Addressed               This Visit's Progress     Weight (lb) < 200 lb (90.7 kg) (pt-stated)   171 lb (77.6 kg)     Stop the vape and lose some weight       Depression Screen    08/11/2022   10:03 AM 08/11/2022    9:59 AM 07/26/2022    3:36 PM 07/13/2022    3:33 PM 02/02/2022     9:42 AM 06/15/2021   10:58 AM 05/20/2021    1:38 PM  PHQ 2/9 Scores  PHQ - 2 Score 0 0 0 0 0 0 0  PHQ- 9 Score 0 0 0 0 0 0     Fall Risk    08/11/2022   10:01 AM 07/26/2022    3:36 PM 07/13/2022    3:33 PM 02/02/2022    9:42 AM 05/20/2021    1:43 PM  Fall Risk   Falls in the past year? 0 0 0  0 1  Number falls in past yr: 0 0 0 0 0  Injury with Fall? 0 0 0 0 1  Risk for fall due to : No Fall Risks No Fall Risks No Fall Risks No Fall Risks History of fall(s)  Follow up Falls evaluation completed;Falls prevention discussed Falls evaluation completed;Falls prevention discussed Falls evaluation completed Falls evaluation completed Falls prevention discussed    MEDICARE RISK AT HOME:  Medicare Risk at Home - 08/11/22 1001     Any stairs in or around the home? Yes    If so, are there any without handrails? No    Home free of loose throw rugs in walkways, pet beds, electrical cords, etc? Yes    Adequate lighting in your home to reduce risk of falls? Yes    Life alert? No   advised to get   Use of a cane, walker or w/c? No    Grab bars in the bathroom? Yes    Shower chair or bench in shower? Yes    Elevated toilet seat or a handicapped toilet? No               Cognitive Function:        08/11/2022   10:03 AM  6CIT Screen  What Year? 0 points  What month? 0 points  What time? 0 points  Count back from 20 0 points  Months in reverse 0 points  Repeat phrase 0 points  Total Score 0 points    Immunizations Immunization History  Administered Date(s) Administered   Fluad Quad(high Dose 65+) 01/01/2020   Hep A / Hep B 04/15/2021   PFIZER(Purple Top)SARS-COV-2 Vaccination 02/11/2019, 03/07/2019, 10/09/2019, 05/01/2020   Tdap 04/15/2021    TDAP status: Up to date  Flu Vaccine status: Up to date  Pneumococcal vaccine status: Declined,  Education has been provided regarding the importance of this vaccine but patient still declined. Advised may receive this vaccine at local  pharmacy or Health Dept. Aware to provide a copy of the vaccination record if obtained from local pharmacy or Health Dept. Verbalized acceptance and understanding.   Covid-19 vaccine status: Declined, Education has been provided regarding the importance of this vaccine but patient still declined. Advised may receive this vaccine at local pharmacy or Health Dept.or vaccine clinic. Aware to provide a copy of the vaccination record if obtained from local pharmacy or Health Dept. Verbalized acceptance and understanding.  Qualifies for Shingles Vaccine? No   Zostavax completed No   Shingrix Completed?: Yes  Screening Tests Health Maintenance  Topic Date Due   COVID-19 Vaccine (5 - 2023-24 season) 08/27/2022 (Originally 09/04/2021)   Zoster Vaccines- Shingrix (1 of 2) 10/13/2022 (Originally 04/08/2002)   Pneumonia Vaccine 34+ Years old (1 of 2 - PCV) 02/03/2023 (Originally 04/08/1956)   INFLUENZA VACCINE  04/04/2023 (Originally 08/05/2022)   MAMMOGRAM  06/26/2023   Medicare Annual Wellness (AWV)  08/11/2023   Colonoscopy  03/12/2028   DTaP/Tdap/Td (2 - Td or Tdap) 04/16/2031   DEXA SCAN  Completed   Hepatitis C Screening  Completed   HPV VACCINES  Aged Out    Health Maintenance  There are no preventive care reminders to display for this patient.   Colorectal cancer screening: Type of screening: Colonoscopy. Completed 03/12/21. Repeat every 7 years  Mammogram status: Completed 07/09/21. Repeat every year  Bone Density status: Completed 06/25/21. Results reflect: Bone density results: OSTEOPENIA. Repeat every 2 years.  Lung Cancer Screening: (Low Dose CT Chest recommended if Age 72-80  years, 20 pack-year currently smoking OR have quit w/in 15years.) does qualify.   Lung Cancer Screening Referral: yes  Additional Screening:  Hepatitis C Screening: does qualify; Completed does not want  Vision Screening: Recommended annual ophthalmology exams for early detection of glaucoma and other disorders  of the eye. Is the patient up to date with their annual eye exam?  Yes  Who is the provider or what is the name of the office in which the patient attends annual eye exams? [facility]   Dental Screening: Recommended annual dental exams for proper oral hygiene- Dr Murray Hodgkins Resource Referral / Chronic Care Management: CRR required this visit?  No   CCM required this visit?  No     Plan:     I have personally reviewed and noted the following in the patient's chart:   Medical and social history Use of alcohol, tobacco or illicit drugs  Current medications and supplements including opioid prescriptions. Patient is not currently taking opioid prescriptions. Functional ability and status Nutritional status Physical activity Advanced directives List of other physicians Hospitalizations, surgeries, and ER visits in previous 12 months Vitals Screenings to include cognitive, depression, and falls Referrals and appointments  In addition, I have reviewed and discussed with patient certain preventive protocols, quality metrics, and best practice recommendations. A written personalized care plan for preventive services as well as general preventive health recommendations were provided to patient.     Everitt Amber   08/11/2022   After Visit Summary: (MyChart) Due to this being a telephonic visit, the after visit summary with patients personalized plan was offered to patient via MyChart   Nurse Notes: no concerns

## 2022-08-13 ENCOUNTER — Ambulatory Visit
Admission: RE | Admit: 2022-08-13 | Discharge: 2022-08-13 | Disposition: A | Discharge status: Home / Self Care | Payer: Medicare Other | Source: Ambulatory Visit | Attending: Family Medicine | Admitting: Family Medicine

## 2022-08-13 DIAGNOSIS — Z1231 Encounter for screening mammogram for malignant neoplasm of breast: Secondary | ICD-10-CM | POA: Diagnosis not present

## 2022-08-13 DIAGNOSIS — R92333 Mammographic heterogeneous density, bilateral breasts: Secondary | ICD-10-CM | POA: Diagnosis not present

## 2022-08-13 DIAGNOSIS — N6489 Other specified disorders of breast: Secondary | ICD-10-CM

## 2022-08-13 DIAGNOSIS — R928 Other abnormal and inconclusive findings on diagnostic imaging of breast: Secondary | ICD-10-CM | POA: Diagnosis not present

## 2022-08-27 ENCOUNTER — Other Ambulatory Visit: Payer: Self-pay | Admitting: *Deleted

## 2022-08-27 DIAGNOSIS — Z122 Encounter for screening for malignant neoplasm of respiratory organs: Secondary | ICD-10-CM

## 2022-08-27 DIAGNOSIS — Z87891 Personal history of nicotine dependence: Secondary | ICD-10-CM

## 2022-09-28 ENCOUNTER — Encounter: Payer: Self-pay | Admitting: Acute Care

## 2022-09-28 ENCOUNTER — Ambulatory Visit: Payer: Medicare Other | Admitting: Acute Care

## 2022-09-28 DIAGNOSIS — Z87891 Personal history of nicotine dependence: Secondary | ICD-10-CM

## 2022-09-28 NOTE — Patient Instructions (Signed)

## 2022-09-28 NOTE — Progress Notes (Signed)
Virtual Visit via Telephone Note  I connected with Taylor Marshall on 09/28/22 at  2:30 PM EDT by telephone and verified that I am speaking with the correct person using two identifiers.  Location: Patient: At home Provider: [address], [place], [place], [address]    I discussed the limitations, risks, security and privacy concerns of performing an evaluation and management service by telephone and the availability of in person appointments. I also discussed with the patient that there may be a patient responsible charge related to this service. The patient expressed understanding and agreed to proceed.   Shared Decision Making Visit Lung Cancer Screening Program [phone])   Eligibility: Age 72 y.o. Pack Years Smoking History Calculation 20 pack year smoking history (# packs/per year x # years smoked) Recent History of coughing up blood  No Unexplained weight loss? no ( >Than 15 pounds within the last 6 months ) Prior History Lung / other cancer no (Diagnosis within the last 5 years already requiring surveillance chest CT Scans). Smoking Status Former Smoker Former Smokers: Years since quit: 5 years  Quit Date:  2019   Visit Components: Discussion included one or more decision making aids. yes Discussion included risk/benefits of screening. yes Discussion included potential follow up diagnostic testing for abnormal scans. yes Discussion included meaning and risk of over diagnosis. yes Discussion included meaning and risk of False Positives. yes Discussion included meaning of total radiation exposure. yes  Counseling Included: Importance of adherence to annual lung cancer LDCT screening. yes Impact of comorbidities on ability to participate in the program. yes Ability and willingness to under diagnostic treatment. yes  Smoking Cessation Counseling: Current Smokers:  Discussed importance of smoking cessation. yes Information about tobacco cessation classes and  interventions provided to patient. yes Patient provided with "ticket" for LDCT Scan. yes Symptomatic Patient. no  Counseling NA Diagnosis Code: Tobacco Use Z72.0 Asymptomatic Patient yes  Counseling  NA Former Smokers:  Discussed the importance of maintaining cigarette abstinence. yes Diagnosis Code: Personal History of Nicotine Dependence. B14.782 Information about tobacco cessation classes and interventions provided to patient. Yes Patient provided with "ticket" for LDCT Scan. yes Written Order for Lung Cancer Screening with LDCT placed in Epic. Yes (CT Chest Lung Cancer Screening Low Dose W/O CM) [id number] Z12.2-Screening of respiratory organs Z87.891-Personal history of nicotine dependence   [name], NP 09/28/2022

## 2022-09-29 ENCOUNTER — Ambulatory Visit
Admission: RE | Admit: 2022-09-29 | Discharge: 2022-09-29 | Disposition: A | Discharge status: Home / Self Care | Payer: Medicare Other | Source: Ambulatory Visit | Attending: Acute Care | Admitting: Acute Care

## 2022-09-29 DIAGNOSIS — Z87891 Personal history of nicotine dependence: Secondary | ICD-10-CM | POA: Insufficient documentation

## 2022-09-29 DIAGNOSIS — Z122 Encounter for screening for malignant neoplasm of respiratory organs: Secondary | ICD-10-CM | POA: Diagnosis not present

## 2022-10-13 ENCOUNTER — Telehealth: Payer: Self-pay | Admitting: Acute Care

## 2022-10-13 DIAGNOSIS — R911 Solitary pulmonary nodule: Secondary | ICD-10-CM

## 2022-10-13 NOTE — Telephone Encounter (Signed)
I have attempted to call the patient with the results of their  Low Dose CT Chest Lung cancer screening scan. There was no answer. I have left a HIPPA compliant VM requesting the patient call the office for the scan results. I included the office contact information in the message. We will await his return call. If no return call we will continue to call until patient is contacted.   I have consulted with Dr. Jayme Cloud on this patient as she is a prolonged inpatient.  Her scan was read as a 4B and she is going to need a PET scan and follow-up with Dr. Jayme Cloud.  When she calls back and I can explain this to her we will get the PET scan scheduled and we will make sure she has follow-up with Dr. Reece Agar in [place]. Please let me know if patient returns the call.  Thank you so much

## 2022-10-13 NOTE — Telephone Encounter (Signed)
I have called the patient with the results of her low dose Ct Chest. I explained that her scan was read as a LR 4 B. There is a new lefy apical nodule that is 28.2 mm that is new from 03/2013. I explained that we need to follow up with a PET scan to further evaluate the nodule. I told her I would order her PET scan at [facility], and she will follow up with Dr. Jayme Cloud at the [place] office after the scan has been completed to review results and discuss plan of care based on results. She is in agreement  with the plan. Please fax results to PCP and let them know plan of care. Thanks so much. Taylor Marshall, patient will need follow up with Dr. Reece Agar after the PET scan. Can you get her scheduled once you see the PET has been scheduled? Thanks so much

## 2022-10-13 NOTE — Telephone Encounter (Signed)
Patient returned call to Sarah and left VM. Patient can be called at [phone].

## 2022-10-13 NOTE — Telephone Encounter (Signed)
See other phone note

## 2022-10-13 NOTE — Telephone Encounter (Signed)
Will schedule OV once PET has been scheduled.  

## 2022-10-18 NOTE — Telephone Encounter (Signed)
Spoke to patient and scheduled appt 11/01/22 at 4:00. Directions provided.  Nothing further needed.

## 2022-10-25 ENCOUNTER — Ambulatory Visit
Admission: RE | Admit: 2022-10-25 | Discharge: 2022-10-25 | Disposition: A | Discharge status: Home / Self Care | Payer: Medicare Other | Source: Ambulatory Visit | Attending: Acute Care | Admitting: Acute Care

## 2022-10-25 DIAGNOSIS — R911 Solitary pulmonary nodule: Secondary | ICD-10-CM | POA: Insufficient documentation

## 2022-10-25 DIAGNOSIS — R918 Other nonspecific abnormal finding of lung field: Secondary | ICD-10-CM | POA: Diagnosis not present

## 2022-10-25 LAB — GLUCOSE, CAPILLARY: Glucose-Capillary: 108 mg/dL — ABNORMAL HIGH (ref 70–99)

## 2022-10-25 MED ORDER — FLUDEOXYGLUCOSE F - 18 (FDG) INJECTION
9.4500 | Freq: Once | INTRAVENOUS | Status: AC | PRN
  Administered 2022-10-25: 9.45 via INTRAVENOUS

## 2022-11-01 ENCOUNTER — Encounter: Payer: Self-pay | Admitting: Pulmonary Disease

## 2022-11-01 ENCOUNTER — Ambulatory Visit (INDEPENDENT_AMBULATORY_CARE_PROVIDER_SITE_OTHER): Payer: Medicare Other | Admitting: Pulmonary Disease

## 2022-11-01 VITALS — BP 122/72 | HR 91 | Temp 97.9°F | Ht 67.0 in | Wt 176.4 lb

## 2022-11-01 DIAGNOSIS — R911 Solitary pulmonary nodule: Secondary | ICD-10-CM

## 2022-11-01 DIAGNOSIS — J449 Chronic obstructive pulmonary disease, unspecified: Secondary | ICD-10-CM | POA: Diagnosis not present

## 2022-11-01 NOTE — Patient Instructions (Signed)
We reviewed the chest CT and PET/CT today.  At present I do not think there is anything to worry.  I will call you with the results from the radiologist once these are available.  I am getting breathing tests ordered to evaluate your lung function.  Will see you in follow-up in 2 months time call sooner should any new problems arise.

## 2022-11-01 NOTE — Progress Notes (Unsigned)
Subjective:    Patient ID: Taylor Marshall, female    DOB: [birth year], 72 y.o.   MRN: [id number]  Patient Care Team: Duanne Limerick, MD as PCP - General (Family Medicine)  Chief Complaint  Patient presents with   pulmonary consult    PET 10/21--no current sx.     BACKGROUND:   HPI    Review of Systems A 10 point review of systems was performed and it is as noted above otherwise negative.   Past Medical History:  Diagnosis Date   GERD (gastroesophageal reflux disease)     Past Surgical History:  Procedure Laterality Date   BREAST EXCISIONAL BIOPSY Right    COLONOSCOPY  2006?   Dr Maryruth Bun   VAGINAL HYSTERECTOMY      Patient Active Problem List   Diagnosis Date Noted   Pharyngitis 12/01/2021   Hiatal hernia 03/29/2021   H/O adenomatous polyp of colon 03/29/2021   Hepatic steatosis 03/24/2021   Nicotine dependence due to vaping non-tobacco product 02/15/2021   Gallbladder sludge 02/10/2021   Bright red rectal bleeding 02/10/2021   Left flank pain 02/07/2013    Family History  Problem Relation Age of Onset   Cancer Mother    Hypertension Mother    Diabetes Mother    Hypertension Father    Heart failure Father    Heart disease Father    Atrial fibrillation Father    Breast cancer Neg Hx     Social History   Tobacco Use   Smoking status: Former    Current packs/day: 1.00    Average packs/day: 1 pack/day for 20.0 years (20.0 ttl pk-yrs)    Types: E-cigarettes, Cigarettes   Smokeless tobacco: Never   Tobacco comments:    Quit cigarettes 2019    Quit e-cigs 09/2022  Substance Use Topics   Alcohol use: No    Alcohol/week: 0.0 standard drinks of alcohol    Allergies  Allergen Reactions   Morphine And Codeine Nausea Only   Buprenorphine Hcl Nausea Only    Current Meds  Medication Sig   omeprazole (PRILOSEC) 20 MG capsule Take 20 mg by mouth daily.    Immunization History  Administered Date(s) Administered   Fluad Quad(high Dose 65+)  01/01/2020   Hep A / Hep B 04/15/2021   PFIZER(Purple Top)SARS-COV-2 Vaccination 02/11/2019, 03/07/2019, 10/09/2019, 05/01/2020   Tdap 04/15/2021        Objective:     BP 122/72 (BP Location: Right Arm, Cuff Size: Normal)   Pulse 91   Temp 97.9 F (36.6 C) (Temporal)   Ht 5\' 7"  (1.702 m)   Wt 176 lb 6.4 oz (80 kg)   SpO2 96%   BMI 27.63 kg/m   SpO2: 96 % O2 Device: None (Room air)  GENERAL: HEAD: Normocephalic, atraumatic.  EYES: Pupils equal, round, reactive to light.  No scleral icterus.  MOUTH:  NECK: Supple. No thyromegaly. Trachea midline. No JVD.  No adenopathy. PULMONARY: Good air entry bilaterally.  No adventitious sounds. CARDIOVASCULAR: S1 and S2. Regular rate and rhythm.  ABDOMEN: MUSCULOSKELETAL: No joint deformity, no clubbing, no edema.  NEUROLOGIC:  SKIN: Intact,warm,dry. PSYCH:        Assessment & Plan:   No diagnosis found.  No orders of the defined types were placed in this encounter.   No orders of the defined types were placed in this encounter.       Gailen Shelter, MD Advanced Bronchoscopy P[facility]    *This note was dictated  using voice recognition software/Dragon.  Despite best efforts to proofread, errors can occur which can change the meaning. Any transcriptional errors that result from this process are unintentional and may not be fully corrected at the time of dictation.

## 2022-11-10 DIAGNOSIS — K219 Gastro-esophageal reflux disease without esophagitis: Secondary | ICD-10-CM | POA: Diagnosis not present

## 2022-11-10 DIAGNOSIS — Z860101 Personal history of adenomatous and serrated colon polyps: Secondary | ICD-10-CM | POA: Diagnosis not present

## 2022-11-10 DIAGNOSIS — K760 Fatty (change of) liver, not elsewhere classified: Secondary | ICD-10-CM | POA: Diagnosis not present

## 2022-11-24 DIAGNOSIS — H9202 Otalgia, left ear: Secondary | ICD-10-CM | POA: Diagnosis not present

## 2022-11-24 DIAGNOSIS — M542 Cervicalgia: Secondary | ICD-10-CM | POA: Diagnosis not present

## 2022-11-29 ENCOUNTER — Encounter: Payer: Self-pay | Admitting: Family Medicine

## 2022-11-29 ENCOUNTER — Ambulatory Visit: Payer: Self-pay

## 2022-11-29 ENCOUNTER — Ambulatory Visit (INDEPENDENT_AMBULATORY_CARE_PROVIDER_SITE_OTHER): Payer: Medicare Other | Admitting: Family Medicine

## 2022-11-29 VITALS — BP 124/78 | HR 90 | Ht 67.0 in | Wt 181.4 lb

## 2022-11-29 DIAGNOSIS — R079 Chest pain, unspecified: Secondary | ICD-10-CM | POA: Diagnosis not present

## 2022-11-29 DIAGNOSIS — M5090 Cervical disc disorder, unspecified, unspecified cervical region: Secondary | ICD-10-CM

## 2022-11-29 DIAGNOSIS — M542 Cervicalgia: Secondary | ICD-10-CM

## 2022-11-29 DIAGNOSIS — M940 Chondrocostal junction syndrome [Tietze]: Secondary | ICD-10-CM | POA: Diagnosis not present

## 2022-11-29 MED ORDER — MELOXICAM 15 MG PO TABS: 15.0000 mg | ORAL_TABLET | Freq: Every day | ORAL | 0 refills | Status: DC

## 2022-11-29 NOTE — Progress Notes (Signed)
Date:  11/29/2022   Name:  Taylor Marshall   DOB:  Jun 15, 1950   MRN:  [id number]   Chief Complaint: Neck Pain (Started a few months ago. Getting worse. Hurts in left arm. No tingling or numbness. Hurts in left chest. )  Neck Pain  This is a chronic problem. The current episode started more than 1 month ago. The problem occurs constantly. The problem has been waxing and waning. The pain is associated with nothing. The pain is present in the left side. The quality of the pain is described as aching. The pain is at a severity of 5/10 (baseline). The pain is moderate. The symptoms are aggravated by position. The pain is Worse during the day. Associated symptoms include chest pain. Pertinent negatives include no fever, numbness, paresis or weakness. She has tried acetaminophen and NSAIDs for the symptoms. The treatment provided mild relief.  Chest Pain  This is a new problem. The current episode started 1 to 4 weeks ago (7-10 days). The problem has been waxing and waning. The pain is present in the lateral region (left anterior). The quality of the pain is described as stabbing. The pain radiates to the left arm. Associated symptoms include palpitations. Pertinent negatives include no abdominal pain, cough, diaphoresis, exertional chest pressure, fever, irregular heartbeat, nausea, numbness, shortness of breath or weakness. The pain is aggravated by nothing. She has tried NSAIDs and acetaminophen (ASA) for the symptoms.    Lab Results  Component Value Date   NA 137 09/28/2021   K 4.0 09/28/2021   CO2 22 09/28/2021   GLUCOSE 141 (H) 09/28/2021   BUN 12 09/28/2021   CREATININE 0.75 09/28/2021   CALCIUM 9.6 09/28/2021   GFRNONAA >60 09/28/2021   No results found for: "CHOL", "HDL", "LDLCALC", "LDLDIRECT", "TRIG", "CHOLHDL" No results found for: "TSH" No results found for: "HGBA1C" Lab Results  Component Value Date   WBC 12.5 (H) 09/28/2021   HGB 14.0 09/28/2021   HCT 43.9 09/28/2021    MCV 82.5 09/28/2021   PLT 274 09/28/2021   Lab Results  Component Value Date   ALT 13 09/28/2021   AST 20 09/28/2021   ALKPHOS 88 09/28/2021   BILITOT 0.9 09/28/2021   No results found for: "25OHVITD2", "25OHVITD3", "VD25OH"   Review of Systems  Constitutional:  Negative for diaphoresis and fever.  HENT:  Negative for congestion, ear discharge and sinus pressure.   Eyes:  Negative for visual disturbance.  Respiratory:  Negative for apnea, cough, chest tightness, shortness of breath and wheezing.   Cardiovascular:  Positive for chest pain and palpitations.  Gastrointestinal:  Negative for abdominal pain, constipation and nausea.  Musculoskeletal:  Positive for neck pain.  Neurological:  Negative for weakness and numbness.    Patient Active Problem List   Diagnosis Date Noted   Pharyngitis 12/01/2021   Hiatal hernia 03/29/2021   H/O adenomatous polyp of colon 03/29/2021   Hepatic steatosis 03/24/2021   Nicotine dependence due to vaping non-tobacco product 02/15/2021   Gallbladder sludge 02/10/2021   Bright red rectal bleeding 02/10/2021   Left flank pain 02/07/2013    Allergies  Allergen Reactions   Morphine And Codeine Nausea Only   Buprenorphine Hcl Nausea Only    Past Surgical History:  Procedure Laterality Date   BREAST EXCISIONAL BIOPSY Right    COLONOSCOPY  2006?   Dr Maryruth Bun   VAGINAL HYSTERECTOMY      Social History   Tobacco Use   Smoking status: Former  Current packs/day: 1.00    Average packs/day: 1 pack/day for 20.0 years (20.0 ttl pk-yrs)    Types: E-cigarettes, Cigarettes   Smokeless tobacco: Never   Tobacco comments:    Quit cigarettes 2019    Quit e-cigs 09/2022  Vaping Use   Vaping status: Never Used  Substance Use Topics   Alcohol use: No    Alcohol/week: 0.0 standard drinks of alcohol   Drug use: No     Medication list has been reviewed and updated.  Current Meds  Medication Sig   omeprazole (PRILOSEC) 20 MG capsule Take 20 mg  by mouth daily.   triamcinolone (NASACORT) 55 MCG/ACT AERO nasal inhaler Place 2 sprays into the nose daily.       11/29/2022    3:38 PM 07/26/2022    3:36 PM 07/13/2022    3:33 PM 02/02/2022    9:42 AM  GAD 7 : Generalized Anxiety Score  Nervous, Anxious, on Edge 3 0 0 0  Control/stop worrying 3 0 0 0  Worry too much - different things 3 0 0 0  Trouble relaxing 3 0 0 0  Restless 3 0 0 0  Easily annoyed or irritable 3 0 0 0  Afraid - awful might happen 3 0 0 0  Total GAD 7 Score 21 0 0 0  Anxiety Difficulty Somewhat difficult Not difficult at all Not difficult at all Not difficult at all       11/29/2022    3:37 PM 08/11/2022   10:03 AM 08/11/2022    9:59 AM  Depression screen PHQ 2/9  Decreased Interest 2 0 0  Down, Depressed, Hopeless 2 0 0  PHQ - 2 Score 4 0 0  Altered sleeping 3 0 0  Tired, decreased energy 2 0 0  Change in appetite 3 0 0  Feeling bad or failure about yourself  3 0 0  Trouble concentrating 3 0 0  Moving slowly or fidgety/restless 3 0 0  Suicidal thoughts 3 0 0  PHQ-9 Score 24 0 0  Difficult doing work/chores Somewhat difficult Not difficult at all Not difficult at all    BP Readings from Last 3 Encounters:  11/29/22 124/78  11/01/22 122/72  07/26/22 118/74    Physical Exam Vitals and nursing note reviewed. Exam conducted with a chaperone present.  Constitutional:      General: She is not in acute distress.    Appearance: She is not diaphoretic.  HENT:     Head: Normocephalic and atraumatic.     Right Ear: External ear normal.     Left Ear: External ear normal.     Nose: Nose normal.  Eyes:     General:        Right eye: No discharge.        Left eye: No discharge.     Conjunctiva/sclera: Conjunctivae normal.     Pupils: Pupils are equal, round, and reactive to light.  Neck:     Thyroid: No thyromegaly.     Vascular: No JVD.  Cardiovascular:     Rate and Rhythm: Normal rate and regular rhythm.     Heart sounds: Normal heart sounds, S1  normal and S2 normal. No murmur heard.    No systolic murmur is present.     No diastolic murmur is present.     No friction rub. No gallop. No S3 or S4 sounds.  Pulmonary:     Effort: Pulmonary effort is normal.     Breath sounds:  Normal breath sounds. No wheezing, rhonchi or rales.  Chest:     Chest wall: Tenderness present.     Comments: Tender along costochondral margin Abdominal:     General: Bowel sounds are normal.     Palpations: Abdomen is soft. There is no mass.     Tenderness: There is no abdominal tenderness. There is no guarding.  Musculoskeletal:        General: Normal range of motion.     Cervical back: Normal range of motion and neck supple.     Right lower leg: No edema.     Left lower leg: No edema.  Lymphadenopathy:     Cervical: No cervical adenopathy.  Skin:    General: Skin is warm and dry.  Neurological:     Mental Status: She is alert.     Deep Tendon Reflexes: Reflexes are normal and symmetric.     Wt Readings from Last 3 Encounters:  11/29/22 181 lb 6.4 oz (82.3 kg)  11/01/22 176 lb 6.4 oz (80 kg)  08/11/22 171 lb (77.6 kg)    BP 124/78   Pulse 90   Ht 5\' 7"  (1.702 m)   Wt 181 lb 6.4 oz (82.3 kg)   SpO2 96%   BMI 28.41 kg/m   Assessment and Plan: 1. Chest pain, unspecified type New onset.  Episodic.  Controlled.  Atypical in location and there is tenderness over the area and this has been associated with 2 falls.  However pain has been brought to her attention is perhaps cardiac given her risk factors and we will further evaluate with EKG as follows: Sinus rhythm.  Rate 81.  Intervals normal.  No voltage criteria met for LVH.  No ischemic changes such as Q waves, ST-T wave changes nor delay in R wave progression.  EKG is essentially read within normal limits.  Examination notes tenderness over the costochondral area with point tenderness in the third costochondral left area.  This is consistent with an inflammatory condition and we will treat  accordingly below. - EKG 12-Lead  2. Neck pain Chronic.  Over the course of several years it was noted that patient had degenerative disc disease which was seen on x-ray on December 2021.  Severe multilevel degenerative changes which had worsened from 2010.  Patient had 2 falls of which developed some discomfort on the left side of her neck extending into the left shoulder and down left arm.  Patient has been taking aspirin and Tylenol.  We will repeat a cervical spine given the 2 falls she is endured in the last week and initiate meloxicam 15 mg once a day as well as encouraged Tylenol as well if pain continues patient was been encouraged to return with further evaluation. - DG Cervical Spine Complete - meloxicam (MOBIC) 15 MG tablet; Take 1 tablet (15 mg total) by mouth daily.  Dispense: 30 tablet; Refill: 0  3. Cervical disc disease Cervical disc disease as noted below.  We will resume nonsteroidal anti-inflammatory of choice and proceed with x-ray to see if there is any pathology such as dislocation or cervical slippage. - meloxicam (MOBIC) 15 MG tablet; Take 1 tablet (15 mg total) by mouth daily.  Dispense: 30 tablet; Refill: 0  4. Costochondritis Tenderness noted over the left third costochondral margin.  This is consistent with costochondritis but ruled out of cardiac was necessary.  Patient will undergo costochondral therapy with an NSAID and has been encouraged to return if pain continues and to go to  the ER if substernal chest pressure or other pain should develop. - meloxicam (MOBIC) 15 MG tablet; Take 1 tablet (15 mg total) by mouth daily.  Dispense: 30 tablet; Refill: 0     Elizabeth Sauer, MD

## 2022-11-29 NOTE — Telephone Encounter (Signed)
Chief Complaint: Neck Pain  Symptoms: left side neck pain 7/10, left ear pain, left arm pain and swelling, chest pain  Frequency: Comes and goes  Pertinent Negatives: Patient denies fever, nausea, injury Disposition: [] ED /[] Urgent Care (no appt availability in office) / [x] Appointment(In office/virtual)/ []  [facility]/ [] Home Care/ [] Refused Recommended Disposition /[] [facility]/ []  Follow-up with PCP Additional Notes: Patient states she has been experiencing neck pain for about 2 months and she went to ENT and was told everything looked fine from related to the ear. Patient reports left ear pain and left arm pain and swelling as well. Patient states she has tried tylenol over the counter without much improvement. Care advice was given and patient has been scheduled today for an appointment with PCP.   Reason for Disposition  [1] MODERATE neck pain (e.g., interferes with normal activities) AND [2] present > 3 days  Answer Assessment - Initial Assessment Questions 1. ONSET: "When did the pain begin?"      2 months  2. LOCATION: "Where does it hurt?"      Left side of the neck  3. PATTERN "Does the pain come and go, or has it been constant since it started?"      Come and go 4. SEVERITY: "How bad is the pain?"  (Scale 1-10; or mild, moderate, severe)   - NO PAIN (0): no pain or only slight stiffness    - MILD (1-3): doesn't interfere with normal activities    - MODERATE (4-7): interferes with normal activities or awakens from sleep    - SEVERE (8-10):  excruciating pain, unable to do any normal activities      7/10 5. RADIATION: "Does the pain go anywhere else, shoot into your arms?"     Shoot into my arm  6. CORD SYMPTOMS: "Any weakness or numbness of the arms or legs?"     No  7. CAUSE: "What do you think is causing the neck pain?"     No  8. NECK OVERUSE: "Any recent activities that involved turning or twisting the neck?"     No 9. OTHER SYMPTOMS: "Do you  have any other symptoms?" (e.g., headache, fever, chest pain, difficulty breathing, neck swelling)     Ear pain, swelling in the left arm  Protocols used: Neck Pain or Stiffness-A-AH

## 2022-11-30 ENCOUNTER — Ambulatory Visit
Admission: RE | Admit: 2022-11-30 | Discharge: 2022-11-30 | Disposition: A | Discharge status: Home / Self Care | Payer: Medicare Other | Source: Ambulatory Visit | Attending: Family Medicine | Admitting: Family Medicine

## 2022-11-30 ENCOUNTER — Ambulatory Visit
Admission: RE | Admit: 2022-11-30 | Discharge: 2022-11-30 | Disposition: A | Discharge status: Home / Self Care | Payer: Medicare Other | Attending: Family Medicine | Admitting: Family Medicine

## 2022-11-30 ENCOUNTER — Encounter: Payer: Self-pay | Admitting: Family Medicine

## 2022-11-30 DIAGNOSIS — M47812 Spondylosis without myelopathy or radiculopathy, cervical region: Secondary | ICD-10-CM | POA: Diagnosis not present

## 2022-11-30 DIAGNOSIS — M4187 Other forms of scoliosis, lumbosacral region: Secondary | ICD-10-CM | POA: Diagnosis not present

## 2022-11-30 DIAGNOSIS — M542 Cervicalgia: Secondary | ICD-10-CM | POA: Diagnosis not present

## 2022-12-14 ENCOUNTER — Encounter: Payer: Self-pay | Admitting: Family Medicine

## 2022-12-14 ENCOUNTER — Ambulatory Visit (INDEPENDENT_AMBULATORY_CARE_PROVIDER_SITE_OTHER): Payer: Medicare Other | Admitting: Family Medicine

## 2022-12-14 VITALS — BP 118/76 | HR 90 | Ht 67.0 in | Wt 178.6 lb

## 2022-12-14 DIAGNOSIS — M7022 Olecranon bursitis, left elbow: Secondary | ICD-10-CM

## 2022-12-14 NOTE — Progress Notes (Signed)
Date:  12/14/2022   Name:  Taylor Marshall   DOB:  06/30/1950   MRN:  [id number]   Chief Complaint: Elbow Injury (Patient has a knot on her left elbow, It has been there for 2 months. Patient has swelling on her elbow. She states when it first happened it was painful but it doesn't hurt now.)  elbow  Arm Pain  The incident occurred more than 1 week ago (template not for pain but for swelling/2 mos). There was no injury mechanism. The pain is present in the left elbow. Quality: radiculopathy. The pain radiates to the left arm. The pain is at a severity of 3/10. The pain is mild. Pertinent negatives include no chest pain, muscle weakness, numbness or tingling. She has tried NSAIDs (meloxicam) for the symptoms.    Lab Results  Component Value Date   NA 137 09/28/2021   K 4.0 09/28/2021   CO2 22 09/28/2021   GLUCOSE 141 (H) 09/28/2021   BUN 12 09/28/2021   CREATININE 0.75 09/28/2021   CALCIUM 9.6 09/28/2021   GFRNONAA >60 09/28/2021   No results found for: "CHOL", "HDL", "LDLCALC", "LDLDIRECT", "TRIG", "CHOLHDL" No results found for: "TSH" No results found for: "HGBA1C" Lab Results  Component Value Date   WBC 12.5 (H) 09/28/2021   HGB 14.0 09/28/2021   HCT 43.9 09/28/2021   MCV 82.5 09/28/2021   PLT 274 09/28/2021   Lab Results  Component Value Date   ALT 13 09/28/2021   AST 20 09/28/2021   ALKPHOS 88 09/28/2021   BILITOT 0.9 09/28/2021   No results found for: "25OHVITD2", "25OHVITD3", "VD25OH"   Review of Systems  Constitutional:  Negative for diaphoresis, fever and unexpected weight change.  HENT:  Negative for congestion.   Eyes:  Negative for visual disturbance.  Respiratory:  Positive for wheezing. Negative for cough, chest tightness and shortness of breath.   Cardiovascular:  Negative for chest pain and palpitations.  Gastrointestinal:  Negative for blood in stool.  Genitourinary:  Negative for hematuria and vaginal bleeding.  Neurological:  Negative for  tingling and numbness.    Patient Active Problem List   Diagnosis Date Noted   Pharyngitis 12/01/2021   Hiatal hernia 03/29/2021   H/O adenomatous polyp of colon 03/29/2021   Hepatic steatosis 03/24/2021   Nicotine dependence due to vaping non-tobacco product 02/15/2021   Gallbladder sludge 02/10/2021   Bright red rectal bleeding 02/10/2021   Left flank pain 02/07/2013    Allergies  Allergen Reactions   Morphine And Codeine Nausea Only   Buprenorphine Hcl Nausea Only    Past Surgical History:  Procedure Laterality Date   BREAST EXCISIONAL BIOPSY Right    COLONOSCOPY  2006?   Dr Maryruth Bun   VAGINAL HYSTERECTOMY      Social History   Tobacco Use   Smoking status: Former    Current packs/day: 1.00    Average packs/day: 1 pack/day for 20.0 years (20.0 ttl pk-yrs)    Types: E-cigarettes, Cigarettes   Smokeless tobacco: Never   Tobacco comments:    Quit cigarettes 2019    Quit e-cigs 09/2022  Vaping Use   Vaping status: Never Used  Substance Use Topics   Alcohol use: No    Alcohol/week: 0.0 standard drinks of alcohol   Drug use: No     Medication list has been reviewed and updated.  Current Meds  Medication Sig   meloxicam (MOBIC) 15 MG tablet Take 1 tablet (15 mg total) by mouth daily.  omeprazole (PRILOSEC) 20 MG capsule Take 20 mg by mouth daily.   triamcinolone (NASACORT) 55 MCG/ACT AERO nasal inhaler Place 2 sprays into the nose daily.       11/29/2022    3:38 PM 07/26/2022    3:36 PM 07/13/2022    3:33 PM 02/02/2022    9:42 AM  GAD 7 : Generalized Anxiety Score  Nervous, Anxious, on Edge 3 0 0 0  Control/stop worrying 3 0 0 0  Worry too much - different things 3 0 0 0  Trouble relaxing 3 0 0 0  Restless 3 0 0 0  Easily annoyed or irritable 3 0 0 0  Afraid - awful might happen 3 0 0 0  Total GAD 7 Score 21 0 0 0  Anxiety Difficulty Somewhat difficult Not difficult at all Not difficult at all Not difficult at all       11/29/2022    3:37 PM  08/11/2022   10:03 AM 08/11/2022    9:59 AM  Depression screen PHQ 2/9  Decreased Interest 2 0 0  Down, Depressed, Hopeless 2 0 0  PHQ - 2 Score 4 0 0  Altered sleeping 3 0 0  Tired, decreased energy 2 0 0  Change in appetite 3 0 0  Feeling bad or failure about yourself  3 0 0  Trouble concentrating 3 0 0  Moving slowly or fidgety/restless 3 0 0  Suicidal thoughts 3 0 0  PHQ-9 Score 24 0 0  Difficult doing work/chores Somewhat difficult Not difficult at all Not difficult at all    BP Readings from Last 3 Encounters:  12/14/22 118/76  11/29/22 124/78  11/01/22 122/72    Physical Exam HENT:     Head: Normocephalic.     Right Ear: Tympanic membrane, ear canal and external ear normal.     Left Ear: Tympanic membrane, ear canal and external ear normal.     Nose: Nose normal. No congestion or rhinorrhea.     Mouth/Throat:     Mouth: Mucous membranes are moist.     Pharynx: No oropharyngeal exudate.  Cardiovascular:     Rate and Rhythm: Normal rate and regular rhythm.     Heart sounds: S1 normal and S2 normal. No murmur heard.    No systolic murmur is present.     No diastolic murmur is present.     No gallop. No S3 or S4 sounds.  Pulmonary:     Breath sounds: No wheezing, rhonchi or rales.  Abdominal:     General: There is no distension.     Tenderness: There is no abdominal tenderness.  Musculoskeletal:     Left elbow: Swelling present. No deformity, effusion or lacerations. Normal range of motion. No tenderness.     Cervical back: Normal range of motion.     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  Onset 2 months ago.  Initially with some discomfort.  Patient has been taking meloxicam for  costochondritis and this has helped.  Patient will continue this on examination there is no tenderness and it is  a fluctuant area that is consistent with a swelling of the left olecranon bursa.  We will refer to sports medicine for evaluation and likely aspiration.  Discussed with Dr. Ashley Royalty and he preferred not to have x-ray at this time   Elizabeth Sauer, MD

## 2022-12-16 ENCOUNTER — Encounter: Payer: Self-pay | Admitting: Family Medicine

## 2022-12-16 ENCOUNTER — Ambulatory Visit: Payer: Medicare Other | Admitting: Family Medicine

## 2022-12-16 ENCOUNTER — Other Ambulatory Visit (INDEPENDENT_AMBULATORY_CARE_PROVIDER_SITE_OTHER): Payer: Medicare Other | Admitting: Radiology

## 2022-12-16 VITALS — BP 120/90 | HR 88 | Ht 67.0 in | Wt 176.2 lb

## 2022-12-16 DIAGNOSIS — M7022 Olecranon bursitis, left elbow: Secondary | ICD-10-CM

## 2022-12-16 MED ORDER — TRIAMCINOLONE ACETONIDE 40 MG/ML IJ SUSP
40.0000 mg | Freq: Once | INTRAMUSCULAR | Status: AC
  Administered 2022-12-16: 40 mg via INTRAMUSCULAR

## 2022-12-16 NOTE — Patient Instructions (Addendum)
You have just been given a cortisone injection to reduce pain and inflammation. After the injection you may notice immediate relief of pain as a result of the Lidocaine. It is important to rest the area of the injection for 24 to 48 hours after the injection. There is a possibility of some temporary increased discomfort and swelling for up to 72 hours until the cortisone begins to work. If you do have pain, simply rest the joint and use ice. If you can tolerate over the counter medications, you can try Tylenol, Aleve, or Advil for added relief per package instructions. YOUR PLAN:  Post-Procedure Care for Olecranon Bursitis  1. Understanding the Condition: Olecranon bursitis is inflammation of the bursa near your elbow, often caused by trauma or repetitive pressure.  2. What We Did Today: We drained the fluid from the bursa and injected a steroid to reduce inflammation.  3. Care Instructions: - Compression Bandage: Apply during the day but remove it at night x 7 days. - Activity: Avoid heavy physical activity for the next two days. - Ice: Apply ice to the area to help reduce inflammation. - Pain Management: Take Tylenol if you experience any pain.  4. Monitoring and Prevention: We will monitor for recurrence and may need to drain the bursa again if necessary. Modify your activities to prevent this condition from recurring.  Please follow these instructions and contact us if you have any concerns or notice signs of infection.

## 2022-12-16 NOTE — Assessment & Plan Note (Signed)
History of Present Illness The patient, with a history of left elbow swelling for the past two months, denies any specific injury but recalls falling a few times. The swelling was initially painful but the pain has since subsided. The patient reports discomfort due to leaning on the elbow frequently. The patient has adjusted her leaning position to avoid direct pressure on the swelling. The patient denies any other associated symptoms. No fevers, chills, redness or drainage.  Physical Exam MUSCULOSKELETAL: No erythema. No pain on palpation or manipulation. Normal strength with resistance. Full range of motion observed. No pain with resistance during hip examination. No pain on resisted palm up maneuver or with palm rotation. +olecranon bursa.  Assessment & Plan Olecranon Bursitis Elbow swelling for two months, initially painful but now painless. Possible history of trauma. No signs of infection. -Drain the bursa today and inject with a steroid. -Apply compression bandage and advise to remove at night. -Advise to avoid heavy physical activity for the next two days. -Recommend ice for inflammation and Tylenol for pain if needed. -Check for recurrence and redrain if necessary. -Advise on modifying activity to prevent recurrence.

## 2022-12-16 NOTE — Progress Notes (Signed)
Primary Care / Sports Medicine Office Visit  Patient Information:  Patient ID: Taylor Marshall, female DOB: 12-23-1950 Age: 72 y.o. MRN: [id number]   Taylor Marshall is a pleasant 72 y.o. female presenting with the following:  Chief Complaint  Patient presents with   Bursitis    Patent presents today with bursitis of the left elbow x 2 months. Her elbow does not hurt but is significantly swollen.     Vitals:   12/16/22 1441  BP: (!) 120/90  Pulse: 88  SpO2: 98%   Vitals:   12/16/22 1441  Weight: 176 lb 3.2 oz (79.9 kg)  Height: 5\' 7"  (1.702 m)   Body mass index is 27.6 kg/m.  DG Cervical Spine Complete Result Date: 11/30/2022 CLINICAL DATA:  Left neck pain, fell 2 weeks ago EXAM: CERVICAL SPINE - COMPLETE 4+ VIEW COMPARISON:  01/01/2020 FINDINGS: Frontal, bilateral oblique, lateral views of the cervical spine are obtained. Mild right convex cervical scoliosis, otherwise alignment is anatomic to the C7 level. Cervicothoracic junction is suboptimally visualized due to overlying structures. Stable extensive multilevel spondylosis and facet hypertrophy greatest from C3-4 through C6-7. No evidence of acute fracture. Evaluation of the left neural foramina is limited due to patient positioning. There is mild diffuse right-sided neural foraminal encroachment greatest at the C5-6 level. Prevertebral soft tissues are unremarkable. Lung apices are clear. IMPRESSION: 1. Extensive multilevel cervical spondylosis and facet hypertrophy. 2. Right convex cervical scoliosis. 3. No acute fracture. Electronically Signed   By: Sharlet Salina M.D.   On: 11/30/2022 10:16     Independent interpretation of notes and tests performed by another provider:   None  Procedures performed:   Procedure:  Injection following aspiration of left olecranon bursa under ultrasound guidance. Ultrasound guidance utilized for in-plane approach, large region of heterogeneous echogenicity consistent with  electron bursitis noted, confirmation of total aspiration noted upon completion Samsung HS60 device utilized with permanent recording / reporting. Verbal informed consent obtained and verified. Skin prepped in a sterile fashion. Ethyl chloride for topical local analgesia.  Completed without difficulty and tolerated well. Aspirate: 16 mL serosanguineous nonpurulent fluid Medication: triamcinolone acetonide 40 mg/mL suspension for injection 1 mL total and 5 mL lidocaine 1% without epinephrine utilized for needle placement anesthetic Advised to contact for fevers/chills, erythema, induration, drainage, or persistent bleeding.  Pertinent History, Exam, Impression, and Recommendations:   Problem List Items Addressed This Visit       Musculoskeletal and Integument   Olecranon bursitis of left elbow - Primary   History of Present Illness The patient, with a history of left elbow swelling for the past two months, denies any specific injury but recalls falling a few times. The swelling was initially painful but the pain has since subsided. The patient reports discomfort due to leaning on the elbow frequently. The patient has adjusted her leaning position to avoid direct pressure on the swelling. The patient denies any other associated symptoms. No fevers, chills, redness or drainage.  Physical Exam MUSCULOSKELETAL: No erythema. No pain on palpation or manipulation. Normal strength with resistance. Full range of motion observed. No pain with resistance during hip examination. No pain on resisted palm up maneuver or with palm rotation. +olecranon bursa.  Assessment & Plan Olecranon Bursitis Elbow swelling for two months, initially painful but now painless. Possible history of trauma. No signs of infection. -Drain the bursa today and inject with a steroid. -Apply compression bandage and advise to remove at night. -Advise to avoid heavy  physical activity for the next two days. -Recommend ice for  inflammation and Tylenol for pain if needed. -Check for recurrence and redrain if necessary. -Advise on modifying activity to prevent recurrence.      Relevant Orders   [place] LIMITED JOINT SPACE STRUCTURES UP LEFT     Orders & Medications Medications: No orders of the defined types were placed in this encounter.  Orders Placed This Encounter  Procedures   [place] LIMITED JOINT SPACE STRUCTURES UP LEFT     No follow-ups on file.     Taylor Banana, MD, [facility]   Primary Care Sports Medicine Primary Care and Sports Medicine at [facility]

## 2022-12-26 ENCOUNTER — Other Ambulatory Visit: Payer: Self-pay | Admitting: Family Medicine

## 2022-12-26 DIAGNOSIS — M940 Chondrocostal junction syndrome [Tietze]: Secondary | ICD-10-CM

## 2022-12-26 DIAGNOSIS — M5090 Cervical disc disorder, unspecified, unspecified cervical region: Secondary | ICD-10-CM

## 2022-12-26 DIAGNOSIS — M542 Cervicalgia: Secondary | ICD-10-CM

## 2023-01-04 ENCOUNTER — Ambulatory Visit: Payer: Medicare Other | Attending: Pulmonary Disease

## 2023-01-04 DIAGNOSIS — J449 Chronic obstructive pulmonary disease, unspecified: Secondary | ICD-10-CM | POA: Insufficient documentation

## 2023-01-04 DIAGNOSIS — Z87891 Personal history of nicotine dependence: Secondary | ICD-10-CM | POA: Insufficient documentation

## 2023-01-04 LAB — PULMONARY FUNCTION TEST ARMC ONLY
DL/VA % pred: 116 %
DL/VA: 4.74 ml/min/mmHg/L
DLCO unc % pred: 88 %
DLCO unc: 18.41 ml/min/mmHg
FEF 25-75 Post: 2.6 L/s
FEF 25-75 Pre: 1.79 L/s
FEF2575-%Change-Post: 44 %
FEF2575-%Pred-Post: 128 %
FEF2575-%Pred-Pre: 88 %
FEV1-%Change-Post: 14 %
FEV1-%Pred-Post: 77 %
FEV1-%Pred-Pre: 68 %
FEV1-Post: 1.91 L
FEV1-Pre: 1.67 L
FEV1FVC-%Change-Post: 2 %
FEV1FVC-%Pred-Pre: 103 %
FEV6-%Change-Post: 14 %
FEV6-%Pred-Post: 76 %
FEV6-%Pred-Pre: 66 %
FEV6-Post: 2.36 L
FEV6-Pre: 2.06 L
FEV6FVC-%Pred-Post: 104 %
FEV6FVC-%Pred-Pre: 104 %
FVC-%Change-Post: 11 %
FVC-%Pred-Post: 72 %
FVC-%Pred-Pre: 65 %
FVC-Post: 2.36 L
FVC-Pre: 2.12 L
Post FEV1/FVC ratio: 81 %
Post FEV6/FVC ratio: 100 %
Pre FEV1/FVC ratio: 79 %
Pre FEV6/FVC Ratio: 100 %
RV % pred: 91 %
RV: 2.09 L
TLC % pred: 88 %
TLC: 4.74 L

## 2023-01-04 MED ORDER — ALBUTEROL SULFATE (2.5 MG/3ML) 0.083% IN NEBU
2.5000 mg | INHALATION_SOLUTION | Freq: Once | RESPIRATORY_TRACT | Status: AC
  Administered 2023-01-04: 2.5 mg via RESPIRATORY_TRACT
  Filled 2023-01-04: qty 3

## 2023-01-11 ENCOUNTER — Ambulatory Visit: Payer: Medicare Other | Admitting: Pulmonary Disease

## 2023-01-11 ENCOUNTER — Encounter: Payer: Self-pay | Admitting: Pulmonary Disease

## 2023-01-11 VITALS — BP 120/86 | HR 85 | Temp 97.6°F | Ht 67.0 in | Wt 176.4 lb

## 2023-01-11 DIAGNOSIS — Z7289 Other problems related to lifestyle: Secondary | ICD-10-CM | POA: Diagnosis not present

## 2023-01-11 DIAGNOSIS — R911 Solitary pulmonary nodule: Secondary | ICD-10-CM | POA: Diagnosis not present

## 2023-01-11 DIAGNOSIS — R0602 Shortness of breath: Secondary | ICD-10-CM | POA: Diagnosis not present

## 2023-01-11 DIAGNOSIS — J683 Other acute and subacute respiratory conditions due to chemicals, gases, fumes and vapors: Secondary | ICD-10-CM

## 2023-01-11 LAB — NITRIC OXIDE: Nitric Oxide: 13

## 2023-01-11 MED ORDER — ALBUTEROL SULFATE HFA 108 (90 BASE) MCG/ACT IN AERS: 2.0000 | INHALATION_SPRAY | Freq: Four times a day (QID) | RESPIRATORY_TRACT | 2 refills | Status: AC | PRN

## 2023-01-11 NOTE — Patient Instructions (Signed)
 VISIT SUMMARY:  During today's visit, we discussed your lung health, including your asthma and a previously detected pulmonary nodule. You reported occasional shortness of breath when climbing stairs, and we reviewed your recent lung function tests.  YOUR PLAN:  -ASTHMA/REACTIVE AIRWAYS: Asthma is a condition where your airways become inflamed and narrow, making it hard to breathe. Your lung function improved by 14% after using a nebulizer, which indicates that your airway obstruction is reversible. We have prescribed an albuterol  rescue inhaler for you to use when you experience chest tightness or wheezing. Please monitor how often you use the inhaler, and if you find yourself using it more frequently, we may need to consider a maintenance inhaler.  -PULMONARY NODULE: A pulmonary nodule is a small, round growth in the lung that can be due to inflammation, infection, or other causes. A follow-up CT scan is necessary to monitor the nodule in your left lung, which was initially detected during a lung cancer screening. This will help us  determine if it is improving or if further action is needed.  INSTRUCTIONS:  Please schedule a follow-up appointment in three months. Additionally, ensure you complete the follow-up CT scan for your pulmonary nodule as ordered.

## 2023-01-11 NOTE — Progress Notes (Signed)
 Subjective:    Patient ID: Taylor Marshall, female    DOB: [birth year], 73 y.o.   MRN: [id number]  Patient Care Team: Joshua Cathryne BROCKS, MD as PCP - General [facility])  Chief Complaint  Patient presents with   Follow-up    No cough, shortness of breath or wheezing.     BACKGROUND/INTERVAL:Patient is a 73 year old former smoker who presents for evaluation of abnormal lung cancer screening CT. She is kindly referred by Cathryne Joshua, MD. Patient had LDCT on 29 September 2022 and PET/CT on 25 October 2022, these were reviewed independently and with the patient and her friend who is with her today.   HPI  Discussed the use of AI scribe software for clinical note transcription with the patient, who gave verbal consent to proceed.  History of Present Illness   The patient, with a history of lung abnormalities and possible asthma, reports good overall health with occasional shortness of breath when climbing stairs at home. She denies any current smoking habits but admits to using a nicotine-free vape, which she has recently reduced.  Lung function tests revealed 'twitchy airways,' suggestive of an asthmatic condition. The patient's lung function was at 68%, improving to 77% post-nebulizer, indicating a 14% improvement.  In addition, the patient has a history of a concerning area in the left lung, initially appearing solid and dense on a lung cancer screening. This area was suspected to be an area of inflammation or infection.       Review of Systems A 10 point review of systems was performed and it is as noted above otherwise negative.   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) 01/04/2023   Olecranon bursitis of left elbow 12/16/2022   Pharyngitis 12/01/2021   Hiatal hernia 03/29/2021   H/O adenomatous polyp of colon 03/29/2021   Hepatic steatosis 03/24/2021   Nicotine dependence due to vaping non-tobacco product 02/15/2021    Gallbladder sludge 02/10/2021   Bright red rectal bleeding 02/10/2021   Left flank pain 02/07/2013    Social History   Tobacco Use   Smoking status: Former    Current packs/day: 1.00    Average packs/day: 1 pack/day for 20.0 years (20.0 ttl pk-yrs)    Types: E-cigarettes, Cigarettes   Smokeless tobacco: Never   Tobacco comments:    Quit cigarettes 2019    Quit e-cigs 09/2022  Substance Use Topics   Alcohol use: No    Alcohol/week: 0.0 standard drinks of alcohol    Allergies  Allergen Reactions   Morphine  And Codeine Nausea Only   Buprenorphine Hcl Nausea Only    Current Meds  Medication Sig   meloxicam  (MOBIC ) 15 MG tablet TAKE 1 TABLET (15 MG TOTAL) BY MOUTH DAILY.   omeprazole (PRILOSEC) 20 MG capsule Take 20 mg by mouth daily.    Immunization History  Administered Date(s) Administered   Fluad Quad(high Dose 65+) 01/01/2020   Hep A / Hep B 04/15/2021   PFIZER(Purple Top)SARS-COV-2 Vaccination 02/11/2019, 03/07/2019, 10/09/2019, 05/01/2020   Tdap 04/15/2021        Objective:     BP 120/86 (BP Location: Left Arm, Patient Position: Sitting, Cuff Size: Normal)   Pulse 85   Temp 97.6 F (36.4 C) (Temporal)   Ht 5' 7 (1.702 m)   Wt 176 lb 6.4 oz (80 kg)   SpO2 97%   BMI 27.63 kg/m   SpO2: 97 %  GENERAL: Well-developed, well-nourished woman, no  acute distress, fully ambulatory, no conversational dyspnea. HEAD: Normocephalic, atraumatic.  EYES: Pupils equal, round, reactive to light.  No scleral icterus.  MOUTH: Dentition intact, oral mucosa moist.  No thrush. NECK: Supple. No thyromegaly. Trachea midline. No JVD.  No adenopathy. PULMONARY: Good air entry bilaterally.  No adventitious sounds. CARDIOVASCULAR: S1 and S2. Regular rate and rhythm.  No rubs, murmurs or gallops heard. ABDOMEN: Benign. MUSCULOSKELETAL: No joint deformity, no clubbing, no edema.  NEUROLOGIC: No overt focal deficit, no gait disturbance, speech is fluent. SKIN:  Intact,warm,dry. PSYCH: Mood and behavior normal.  Lab Results  Component Value Date   NITRICOXIDE 13 01/11/2023      Assessment & Plan:     ICD-10-CM   1. Lung nodule seen on imaging study  R91.1 CT CHEST WO CONTRAST    2. Vapes non-nicotine containing substance  Z72.89     3. SOB (shortness of breath)  R06.02 Nitric oxide       Orders Placed This Encounter  Procedures   CT CHEST WO CONTRAST    Standing Status:   Future    Expiration Date:   01/11/2024    Preferred imaging location?:   [facility]   Nitric oxide    Assessment and Plan    Asthma Intermittent dyspnea on exertion with a 14% improvement in lung function post-nebulizer, indicating reversible airway obstruction. Low airway inflammation noted. - Prescribe albuterol  rescue inhaler - Instruct on inhaler use for chest tightness or wheezing - Monitor inhaler use frequency; consider maintenance inhaler if usage increases - Counseled with regards to discontinuing vaping  Pulmonary Nodule Follow-up CT required for a left lung nodule initially detected during lung cancer screening, showing signs of improvement, likely inflammation or infection. - Order follow-up CT scan for pulmonary nodule  Follow-up - Schedule follow-up appointment in three months.      Advised if symptoms do not improve or worsen, to please contact office for sooner follow up or seek emergency care.    I spent 30 minutes of dedicated to the care of this patient on the date of this encounter to include pre-visit review of records, face-to-face time with the patient discussing conditions above, post visit ordering of testing, clinical documentation with the electronic health record, making appropriate referrals as documented, and communicating necessary findings to members of the patients care team.     C. Leita Sanders, MD Advanced Bronchoscopy PCCM [facility]    *This note was generated using voice recognition  software/Dragon and/or AI transcription program.  Despite best efforts to proofread, errors can occur which can change the meaning. Any transcriptional errors that result from this process are unintentional and may not be fully corrected at the time of dictation.

## 2023-01-13 ENCOUNTER — Ambulatory Visit
Admission: RE | Admit: 2023-01-13 | Discharge: 2023-01-13 | Disposition: A | Discharge status: Home / Self Care | Payer: Medicare Other | Source: Ambulatory Visit | Attending: Family Medicine | Admitting: Family Medicine

## 2023-01-13 DIAGNOSIS — R062 Wheezing: Secondary | ICD-10-CM | POA: Diagnosis not present

## 2023-01-13 DIAGNOSIS — R918 Other nonspecific abnormal finding of lung field: Secondary | ICD-10-CM | POA: Diagnosis not present

## 2023-01-13 DIAGNOSIS — R911 Solitary pulmonary nodule: Secondary | ICD-10-CM | POA: Insufficient documentation

## 2023-01-13 DIAGNOSIS — I7123 Aneurysm of the descending thoracic aorta, without rupture: Secondary | ICD-10-CM | POA: Diagnosis not present

## 2023-02-01 DIAGNOSIS — K760 Fatty (change of) liver, not elsewhere classified: Secondary | ICD-10-CM | POA: Diagnosis not present

## 2023-03-17 DIAGNOSIS — L814 Other melanin hyperpigmentation: Secondary | ICD-10-CM | POA: Diagnosis not present

## 2023-03-17 DIAGNOSIS — L821 Other seborrheic keratosis: Secondary | ICD-10-CM | POA: Diagnosis not present

## 2023-04-12 ENCOUNTER — Other Ambulatory Visit: Payer: Self-pay | Admitting: Internal Medicine

## 2023-04-12 DIAGNOSIS — R202 Paresthesia of skin: Secondary | ICD-10-CM | POA: Diagnosis not present

## 2023-04-12 DIAGNOSIS — Z0000 Encounter for general adult medical examination without abnormal findings: Secondary | ICD-10-CM | POA: Diagnosis not present

## 2023-04-12 DIAGNOSIS — M5414 Radiculopathy, thoracic region: Secondary | ICD-10-CM | POA: Diagnosis not present

## 2023-04-12 DIAGNOSIS — R7309 Other abnormal glucose: Secondary | ICD-10-CM | POA: Diagnosis not present

## 2023-04-12 DIAGNOSIS — G2589 Other specified extrapyramidal and movement disorders: Secondary | ICD-10-CM | POA: Diagnosis not present

## 2023-04-12 DIAGNOSIS — M5412 Radiculopathy, cervical region: Secondary | ICD-10-CM

## 2023-04-12 DIAGNOSIS — Z1389 Encounter for screening for other disorder: Secondary | ICD-10-CM | POA: Diagnosis not present

## 2023-04-12 DIAGNOSIS — R7303 Prediabetes: Secondary | ICD-10-CM | POA: Diagnosis not present

## 2023-04-12 DIAGNOSIS — Z79899 Other long term (current) drug therapy: Secondary | ICD-10-CM | POA: Diagnosis not present

## 2023-04-12 DIAGNOSIS — Z780 Asymptomatic menopausal state: Secondary | ICD-10-CM | POA: Diagnosis not present

## 2023-04-14 ENCOUNTER — Ambulatory Visit (INDEPENDENT_AMBULATORY_CARE_PROVIDER_SITE_OTHER): Payer: Medicare Other | Admitting: Pulmonary Disease

## 2023-04-14 ENCOUNTER — Encounter: Payer: Self-pay | Admitting: Radiology

## 2023-04-14 VITALS — BP 106/90 | HR 94 | Temp 97.7°F | Ht 67.0 in | Wt 182.0 lb

## 2023-04-14 DIAGNOSIS — Z7289 Other problems related to lifestyle: Secondary | ICD-10-CM

## 2023-04-14 DIAGNOSIS — J683 Other acute and subacute respiratory conditions due to chemicals, gases, fumes and vapors: Secondary | ICD-10-CM | POA: Diagnosis not present

## 2023-04-14 DIAGNOSIS — R911 Solitary pulmonary nodule: Secondary | ICD-10-CM

## 2023-04-14 NOTE — Patient Instructions (Signed)
 VISIT SUMMARY:  You came in today for a follow-up on your abnormal lung cancer screening CT. Your breathing has been stable, and you have not needed to use your albuterol inhaler. You also mentioned that you are vaping less frequently and plan to join a gym to increase your physical activity.  YOUR PLAN:  -ABNORMAL LUNG CANCER SCREENING CT: An abnormal lung cancer screening CT was followed by a normal scan in January. We will continue to monitor with another scan in June-July. If this scan is stable, you will return to annual screenings. This means that your lungs will be checked once a year to ensure they remain healthy.  -VERY MILD ASTHMA: You have asthma, which means your airways can become inflamed and make it hard to breathe. Your symptoms are currently well-controlled, and you have not needed your albuterol inhaler recently. We will continue to monitor your symptoms and consider maintenance therapy if you start using your inhaler more frequently.  -VAPING: You mentioned that you are vaping occasionally but less frequently than before. Joining a gym and increasing your physical activity may help you reduce vaping even further. Reducing vaping is important for your overall lung health.  INSTRUCTIONS:  Please schedule your lung cancer screening CT for June-July. If the results are normal, you will return to annual screenings. Continue to monitor your asthma symptoms and use your albuterol inhaler as needed. If you find yourself using it more frequently, please let us know. Keep up the good work in reducing vaping and stay active by joining the gym.

## 2023-04-14 NOTE — Progress Notes (Signed)
 Subjective:    Patient ID: Taylor Marshall, female    DOB: 11-17-1950, 73 y.o.   MRN: [id number]  Patient Care Team: Rex Castor, MD as PCP - General (Internal Medicine)  Chief Complaint  Patient presents with   Follow-up    No breathing problems.     BACKGROUND/INTERVAL:Patient is a 73 year old former smoker who presents for follow-up of abnormal lung cancer screening CT.  Patient had LDCT on 29 September 2022 and PET/CT on 25 October 2022.  Follow-up chest CT was done on 13 January 2023 showing improvement on the left apical consolidation consistent with inflammatory process.  Stable 4 mm right lower lobe nodule.  She has been scheduled for follow-up CT.  HPI Discussed the use of AI scribe software for clinical note transcription with the patient, who gave verbal consent to proceed.  History of Present Illness   Taylor Marshall is a 73 year old female who presents for follow-up of an abnormal lung cancer screening CT.  She is following up for an abnormal lung cancer screening CT. Her CT scan in January was reported as showing marked improvement on the changes noted indicating inflammatory process, she is scheduled for another scan in the June-July timeframe.  Her breathing has been stable, and she has not required the use of her albuterol  inhaler. A PFT in December indicated mild asthma.  She confirms that she is not smoking, although she vapes occasionally, but less frequently than before. She is planning to join a gym to increase her physical activity.      Review of Systems A 10 point review of systems was performed and it is as noted above otherwise negative.   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) 01/04/2023   Olecranon bursitis of left elbow 12/16/2022   Pharyngitis 12/01/2021   Hiatal hernia 03/29/2021   H/O adenomatous polyp of colon 03/29/2021   Hepatic steatosis 03/24/2021   Nicotine dependence  due to vaping non-tobacco product 02/15/2021   Gallbladder sludge 02/10/2021   Bright red rectal bleeding 02/10/2021   Left flank pain 02/07/2013    Social History   Tobacco Use   Smoking status: Former    Current packs/day: 1.00    Average packs/day: 1 pack/day for 20.0 years (20.0 ttl pk-yrs)    Types: E-cigarettes, Cigarettes   Smokeless tobacco: Never   Tobacco comments:    Quit cigarettes 2019    Quit e-cigs 09/2022  Substance Use Topics   Alcohol use: No    Alcohol/week: 0.0 standard drinks of alcohol    Allergies  Allergen Reactions   Morphine  And Codeine Nausea Only   Buprenorphine Hcl Nausea Only    Current Meds  Medication Sig   albuterol  (VENTOLIN  HFA) 108 (90 Base) MCG/ACT inhaler Inhale 2 puffs into the lungs every 6 (six) hours as needed for wheezing or shortness of breath.   gabapentin (NEURONTIN) 300 MG capsule Take 1 capsule by mouth 2 (two) times daily.   meloxicam  (MOBIC ) 15 MG tablet TAKE 1 TABLET (15 MG TOTAL) BY MOUTH DAILY.   omeprazole (PRILOSEC) 20 MG capsule Take 20 mg by mouth daily.    Immunization History  Administered Date(s) Administered   Fluad Quad(high Dose 65+) 01/01/2020   Hep A / Hep B 04/15/2021   PFIZER(Purple Top)SARS-COV-2 Vaccination 02/11/2019, 03/07/2019, 10/09/2019, 05/01/2020   Tdap 04/15/2021        Objective:     BP (!) 106/90 (BP Location:  Left Arm, Patient Position: Sitting, Cuff Size: Normal)   Pulse 94   Temp 97.7 F (36.5 C) (Temporal)   Ht 5\' 7"  (1.702 m)   Wt 182 lb (82.6 kg)   SpO2 99%   BMI 28.51 kg/m   SpO2: 99 %  GENERAL: Well-developed, well-nourished woman, no acute distress, fully ambulatory, no conversational dyspnea. HEAD: Normocephalic, atraumatic.  EYES: Pupils equal, round, reactive to light.  No scleral icterus.  MOUTH: Dentition intact, oral mucosa moist.  No thrush. NECK: Supple. No thyromegaly. Trachea midline. No JVD.  No adenopathy. PULMONARY: Good air entry bilaterally.  No  adventitious sounds. CARDIOVASCULAR: S1 and S2. Regular rate and rhythm.  No rubs, murmurs or gallops heard. ABDOMEN: Benign. MUSCULOSKELETAL: No joint deformity, no clubbing, no edema.  NEUROLOGIC: No overt focal deficit, no gait disturbance, speech is fluent. SKIN: Intact,warm,dry. PSYCH: Mood and behavior normal.  Independently reviewed most recent CT of January 2025, reviewed with the patient.  Assessment & Plan:     ICD-10-CM   1. Lung nodule seen on imaging study  R91.1 CT CHEST WO CONTRAST    2. 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 Vapes non-nicotine containing substance  Z72.89       Orders Placed This Encounter  Procedures   CT CHEST WO CONTRAST    Schedule towards the end of June beginning of July.  Follow-up on abnormal lung cancer screening CT.    Standing Status:   Future    Expected Date:   07/11/2023    Expiration Date:   04/13/2024    Preferred imaging location?:   [facility]    Discussion:    Abnormal lung cancer screening CT An abnormal lung cancer screening CT was followed by a very improved scan in January. Continued monitoring is planned with another scan in June-July. If continued improvement noted, she will return to annual screenings. - Order follow-up CT chest in June-July - Revert to annual screenings if the upcoming scan is improved/stable - Communicate scan results  Reactive airways disease Reactive airways disease  is present. She has not required albuterol  recently and reports well-controlled breathing. Monitoring of symptoms is planned, with consideration of maintenance therapy if albuterol  inhaler use increases. - Monitor respiratory symptoms - Consider maintenance therapy if frequent albuterol  inhaler use is required  Vaping She reports occasional vaping with reduced frequency and plans to join a gym, which may further decrease vaping. - Encourage reduction of vaping       Advised if symptoms do not improve or  worsen, to please contact office for sooner follow up or seek emergency care.    I spent 30 minutes of dedicated to the care of this patient on the date of this encounter to include pre-visit review of records, face-to-face time with the patient discussing conditions above, post visit ordering of testing, clinical documentation with the electronic health record, making appropriate referrals as documented, and communicating necessary findings to members of the patients care team.     C. Chloe Counter, MD Advanced Bronchoscopy PCCM [facility]    *This note was generated using voice recognition software/Dragon and/or AI transcription program.  Despite best efforts to proofread, errors can occur which can change the meaning. Any transcriptional errors that result from this process are unintentional and may not be fully corrected at the time of dictation.

## 2023-04-15 ENCOUNTER — Ambulatory Visit

## 2023-04-15 ENCOUNTER — Ambulatory Visit
Admission: RE | Admit: 2023-04-15 | Discharge: 2023-04-15 | Disposition: A | Discharge status: Home / Self Care | Source: Ambulatory Visit | Attending: Internal Medicine | Admitting: Internal Medicine

## 2023-04-15 DIAGNOSIS — M4802 Spinal stenosis, cervical region: Secondary | ICD-10-CM | POA: Diagnosis not present

## 2023-04-15 DIAGNOSIS — M5414 Radiculopathy, thoracic region: Secondary | ICD-10-CM

## 2023-04-15 DIAGNOSIS — M47812 Spondylosis without myelopathy or radiculopathy, cervical region: Secondary | ICD-10-CM | POA: Diagnosis not present

## 2023-04-15 DIAGNOSIS — G8929 Other chronic pain: Secondary | ICD-10-CM | POA: Diagnosis not present

## 2023-04-15 DIAGNOSIS — M5412 Radiculopathy, cervical region: Secondary | ICD-10-CM | POA: Insufficient documentation

## 2023-04-15 DIAGNOSIS — M4804 Spinal stenosis, thoracic region: Secondary | ICD-10-CM | POA: Diagnosis not present

## 2023-04-15 DIAGNOSIS — M5134 Other intervertebral disc degeneration, thoracic region: Secondary | ICD-10-CM | POA: Diagnosis not present

## 2023-04-15 DIAGNOSIS — M47814 Spondylosis without myelopathy or radiculopathy, thoracic region: Secondary | ICD-10-CM | POA: Diagnosis not present

## 2023-04-15 DIAGNOSIS — M542 Cervicalgia: Secondary | ICD-10-CM | POA: Diagnosis not present

## 2023-04-15 DIAGNOSIS — M549 Dorsalgia, unspecified: Secondary | ICD-10-CM | POA: Diagnosis not present

## 2023-04-23 ENCOUNTER — Encounter: Payer: Self-pay | Admitting: Pulmonary Disease

## 2023-06-27 DIAGNOSIS — M4802 Spinal stenosis, cervical region: Secondary | ICD-10-CM | POA: Diagnosis not present

## 2023-06-27 DIAGNOSIS — M4723 Other spondylosis with radiculopathy, cervicothoracic region: Secondary | ICD-10-CM | POA: Diagnosis not present

## 2023-07-07 ENCOUNTER — Other Ambulatory Visit: Payer: Self-pay | Admitting: Physical Medicine & Rehabilitation

## 2023-07-07 DIAGNOSIS — M9931 Osseous stenosis of neural canal of cervical region: Secondary | ICD-10-CM

## 2023-07-07 DIAGNOSIS — M542 Cervicalgia: Secondary | ICD-10-CM

## 2023-07-07 DIAGNOSIS — M5412 Radiculopathy, cervical region: Secondary | ICD-10-CM

## 2023-07-07 NOTE — Progress Notes (Deleted)
 Referring Physician:  Sherial Bail, MD [address] [place],  [place] [address]  Primary Physician:  Sherial Bail, MD  History of Present Illness: 07/07/2023 Taylor Marshall is here today with a chief complaint of ***  Neck and left arm pain?   Numbness and tingling in the left arm?   Duration: *** Location: *** Quality: *** Severity: ***  Precipitating: aggravated by *** Modifying factors: made better by *** Weakness: none Timing: *** Bowel/Bladder Dysfunction: none  Conservative measures:  Physical therapy: *** referral to PT was placed by Dr. Dodson, no appointment yet? Multimodal medical therapy including regular antiinflammatories: *** tylenol, gabaoentin, meloxicam , methocarbamol Injections: *** no epidural steroid injections, Dr. Dodson did order a injection to be done at [facility].   Past Surgery: ***no spinal surgeries  Taylor Marshall has ***no symptoms of cervical myelopathy.  The symptoms are causing a significant impact on the patient's life.   I have utilized the care everywhere function in epic to review the outside records available from external health systems.  Review of Systems:  A 10 point review of systems is negative, except for the pertinent positives and negatives detailed in the HPI.  Past Medical History: Past Medical History:  Diagnosis Date   GERD (gastroesophageal reflux disease)     Past Surgical History: Past Surgical History:  Procedure Laterality Date   BREAST EXCISIONAL BIOPSY Right    COLONOSCOPY  2006?   Dr Chipper   VAGINAL HYSTERECTOMY      Allergies: Allergies as of 07/11/2023 - Review Complete 04/23/2023  Allergen Reaction Noted   Morphine  and codeine Nausea Only 11/27/2014   Buprenorphine hcl Nausea Only 12/16/2014    Medications:  Current Outpatient Medications:    albuterol  (VENTOLIN  HFA) 108 (90 Base) MCG/ACT inhaler, Inhale 2 puffs into the lungs every 6 (six) hours as needed for  wheezing or shortness of breath., Disp: 8.5 g, Rfl: 2   gabapentin (NEURONTIN) 300 MG capsule, Take 1 capsule by mouth 2 (two) times daily., Disp: , Rfl:    meloxicam  (MOBIC ) 15 MG tablet, TAKE 1 TABLET (15 MG TOTAL) BY MOUTH DAILY., Disp: 30 tablet, Rfl: 0   omeprazole (PRILOSEC) 20 MG capsule, Take 20 mg by mouth daily., Disp: , Rfl:   Social History: Social History   Tobacco Use   Smoking status: Former    Current packs/day: 1.00    Average packs/day: 1 pack/day for 20.0 years (20.0 ttl pk-yrs)    Types: E-cigarettes, Cigarettes   Smokeless tobacco: Never   Tobacco comments:    Quit cigarettes 2019    Quit e-cigs 09/2022  Vaping Use   Vaping status: Never Used  Substance Use Topics   Alcohol use: No    Alcohol/week: 0.0 standard drinks of alcohol   Drug use: No    Family Medical History: Family History  Problem Relation Age of Onset   Cancer Mother    Hypertension Mother    Diabetes Mother    Hypertension Father    Heart failure Father    Heart disease Father    Atrial fibrillation Father    Breast cancer Neg Hx     Physical Examination: There were no vitals filed for this visit.  General: Patient is in no apparent distress. Attention to examination is appropriate.  Neck:   Supple.  Full range of motion.  Respiratory: Patient is breathing without any difficulty.   NEUROLOGICAL:     Awake, alert, oriented to person, place, and time.  Speech  is clear and fluent.   Cranial Nerves: Pupils equal round and reactive to light.  Facial tone is symmetric.  Facial sensation is symmetric. Shoulder shrug is symmetric. Tongue protrusion is midline.    Strength: Side Biceps Triceps Deltoid Interossei Grip Wrist Ext. Wrist Flex.  R 5 5 5 5 5 5 5   L 5 5 5 5 5 5 5    Side Iliopsoas Quads Hamstring PF DF EHL  R 5 5 5 5 5 5   L 5 5 5 5 5 5    Reflexes are ***2+ and symmetric at the biceps, triceps, brachioradialis, patella and achilles.   Hoffman's is absent. Clonus is  absent  Bilateral upper and lower extremity sensation is intact to light touch ***.     No evidence of dysmetria noted.  Gait is normal.    Imaging: *** I have personally reviewed the images and agree with the above interpretation.  Medical Decision Making/Assessment and Plan: Taylor Marshall is a pleasant 73 y.o. female with ***  There are no diagnoses linked to this encounter.   Thank you for involving me in the care of this patient.    Penne MICAEL Sharps MD/MSCR Neurosurgery

## 2023-07-11 ENCOUNTER — Ambulatory Visit: Admitting: Neurosurgery

## 2023-07-13 NOTE — Discharge Instructions (Signed)

## 2023-07-14 ENCOUNTER — Ambulatory Visit
Admission: RE | Admit: 2023-07-14 | Discharge: 2023-07-14 | Disposition: A | Discharge status: Home / Self Care | Source: Ambulatory Visit | Attending: Physical Medicine & Rehabilitation | Admitting: Physical Medicine & Rehabilitation

## 2023-07-14 DIAGNOSIS — M5412 Radiculopathy, cervical region: Secondary | ICD-10-CM

## 2023-07-14 DIAGNOSIS — M542 Cervicalgia: Secondary | ICD-10-CM

## 2023-07-14 DIAGNOSIS — M9931 Osseous stenosis of neural canal of cervical region: Secondary | ICD-10-CM

## 2023-07-14 MED ORDER — TRIAMCINOLONE ACETONIDE 40 MG/ML IJ SUSP (RADIOLOGY): 60.0000 mg | Freq: Once | INTRAMUSCULAR | Status: DC

## 2023-07-14 MED ORDER — IOPAMIDOL (ISOVUE-300) INJECTION 61%: 10.0000 mL | Freq: Once | INTRAVENOUS | Status: DC | PRN

## 2023-07-15 ENCOUNTER — Other Ambulatory Visit: Payer: Self-pay | Admitting: Physical Medicine & Rehabilitation

## 2023-07-15 DIAGNOSIS — M5412 Radiculopathy, cervical region: Secondary | ICD-10-CM

## 2023-07-18 ENCOUNTER — Encounter: Payer: Self-pay | Admitting: Neurosurgery

## 2023-07-18 ENCOUNTER — Ambulatory Visit (INDEPENDENT_AMBULATORY_CARE_PROVIDER_SITE_OTHER): Admitting: Neurosurgery

## 2023-07-18 VITALS — BP 128/88 | Ht 67.0 in | Wt 182.0 lb

## 2023-07-18 DIAGNOSIS — M542 Cervicalgia: Secondary | ICD-10-CM

## 2023-07-18 DIAGNOSIS — S43315D Dislocation of left scapula, subsequent encounter: Secondary | ICD-10-CM | POA: Diagnosis not present

## 2023-07-18 DIAGNOSIS — M4802 Spinal stenosis, cervical region: Secondary | ICD-10-CM

## 2023-07-18 DIAGNOSIS — S43314D Dislocation of right scapula, subsequent encounter: Secondary | ICD-10-CM

## 2023-07-18 DIAGNOSIS — M5412 Radiculopathy, cervical region: Secondary | ICD-10-CM | POA: Diagnosis not present

## 2023-07-18 DIAGNOSIS — S43396D Dislocation of other parts of unspecified shoulder girdle, subsequent encounter: Secondary | ICD-10-CM

## 2023-07-18 DIAGNOSIS — R29898 Other symptoms and signs involving the musculoskeletal system: Secondary | ICD-10-CM

## 2023-07-18 NOTE — Progress Notes (Signed)
 Referring Physician:  Sherial Bail, MD [address] [place],  [place] [address]  Primary Physician:  Sherial Bail, MD  History of Present Illness: 07/18/2023 Taylor Marshall is here today with a chief complaint of neck pain radiating into her posterior scalp and left shoulder.  7 approximately 3 months ago after she was lifting a child.  Notably she does have a history of scapular abnormalities.  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  She has never had full use of her proximal upper extremities.  She states that she has not noticed any acute worsening of her upper extremities but that the pain on the left side of her scalp upper shoulder has been quite significant.  She feels it going into her neck and head whenever she moves or turns her head.  She is not getting significant symptoms going down the left arm past the shoulder.  She has recently seen our pain management team and is being planned for CT-guided cervical injections.  Conservative measures:  Physical therapy: has not participated in, discussed possibility with physical medicine rehabilitation Multimodal medical therapy including regular antiinflammatories: tylenol, gabaoentin, meloxicam , methocarbamol Injections: no epidural steroid injections, she is scheduled for an injection on 07/22/23.   Past Surgery: no spinal surgeries  The symptoms are causing a significant impact on the patient's life.   I have utilized the care everywhere function in epic to review the outside records available from external health systems.  Review of Systems:  A 10 point review of systems is negative, except for the pertinent positives and negatives detailed in the HPI.  Past Medical History: Past Medical History:  Diagnosis Date   GERD (gastroesophageal reflux disease)     Past Surgical History: Past Surgical History:  Procedure Laterality Date    BREAST EXCISIONAL BIOPSY Right    COLONOSCOPY  2006?   Dr Chipper   VAGINAL HYSTERECTOMY      Allergies: Allergies as of 07/18/2023 - Review Complete 07/18/2023  Allergen Reaction Noted   Egg solids, whole Diarrhea and Nausea Only 04/12/2023   Morphine  and codeine Nausea Only 11/27/2014   Buprenorphine hcl Nausea Only 12/16/2014    Medications:  Current Outpatient Medications:    albuterol  (VENTOLIN  HFA) 108 (90 Base) MCG/ACT inhaler, Inhale 2 puffs into the lungs every 6 (six) hours as needed for wheezing or shortness of breath., Disp: 8.5 g, Rfl: 2   gabapentin (NEURONTIN) 300 MG capsule, Take 300 mg by mouth 3 (three) times daily., Disp: , Rfl:    ibuprofen (ADVIL) 200 MG tablet, Take 600 mg by mouth 3 (three) times daily as needed., Disp: , Rfl:    omeprazole (PRILOSEC) 20 MG capsule, Take 20 mg by mouth daily., Disp: , Rfl:   Social History: Social History   Tobacco Use   Smoking status: Former    Current packs/day: 1.00    Average packs/day: 1 pack/day for 20.0 years (20.0 ttl pk-yrs)    Types: E-cigarettes, Cigarettes   Smokeless tobacco: Never   Tobacco comments:    Quit cigarettes 2019    Uses vape (no nicotine per patient)  Vaping Use   Vaping status: Some Days  Substance Use Topics   Alcohol use: No    Alcohol/week: 0.0 standard drinks of alcohol   Drug use: No    Family Medical History: Family History  Problem Relation Age of Onset   Cancer Mother    Hypertension Mother  Diabetes Mother    Hypertension Father    Heart failure Father    Heart disease Father    Atrial fibrillation Father    Breast cancer Neg Hx     Physical Examination: Vitals:   07/18/23 0900  BP: 128/88    General: Patient is in no apparent distress. Attention to examination is appropriate.  Neck:   Supple.  Full range of motion.  Respiratory: Patient is breathing without any difficulty.   NEUROLOGICAL:     Awake, alert, oriented to person, place, and time.  Speech  is clear and fluent.   Cranial Nerves: Pupils equal round and reactive to light.  Facial tone is symmetric.  Facial sensation is symmetric. Shoulder shrug is symmetric. Tongue protrusion is midline.    Strength: Her proximal motor grading is somewhat difficult to give formal numbering 2 given her scapular abnormalities.  She has high riding scapula bilaterally.  She does have significant supra and infraspinatus wasting.  She also has minimal rhomboid mass.  She does activate her deltoids bilaterally, appears she can get out to at least 90 degrees, notably her scapula do not rotate internally full range of motion is quite limited.  She has weakness in external rotation bilaterally.  She has good handgrip and elbow flexion.  Imaging: Narrative & Impression  CLINICAL DATA:  Chronic neck pain   EXAM: MRI CERVICAL SPINE WITHOUT CONTRAST   TECHNIQUE: Multiplanar, multisequence MR imaging of the cervical spine was performed. No intravenous contrast was administered.   COMPARISON:  None Available.   FINDINGS: Alignment: Physiologic.   Vertebrae: Degenerative endplate marrow changes at a few levels. Partial interbody fusion at C7-T1. Areas of T1/T2 hyperintensity in lumbar vertebrae most consistent with intraosseous hemangioma or focal fat. No acute fracture is identified.   Cord: Normal signal and morphology.   Posterior Fossa, vertebral arteries, paraspinal tissues: The visualized portions of the skull base and the posterior fossa are normal. No soft tissue abnormality is identified.   Disc levels:   C2-C3: The disk is normal in configuration. Mild bilateral facet arthropathy. No uncovertebral joint disease. No neuroforaminal stenosis. No spinal canal stenosis.   C3-C4: Disc osteophyte complex. Mild bilateral facet arthropathy. Severe bilateral uncovertebral joint disease. Severe bilateral neuroforaminal stenosis. Moderate spinal canal stenosis.   C4-C5: Disc osteophyte complex.  Mild bilateral facet arthropathy. Severe bilateral uncovertebral joint disease. Severe bilateral neuroforaminal stenosis. Moderate spinal canal stenosis.   C5-C6: Disc osteophyte complex. Mild bilateral facet arthropathy. Moderate bilateral uncovertebral joint disease. Moderate bilateral neuroforaminal stenosis. No spinal canal stenosis.   C6-C7: The disk is normal in configuration. Mild bilateral facet arthropathy. Moderate right uncovertebral joint disease. Moderate right neuroforaminal stenosis. No spinal canal stenosis.   C7-T1: The disk is normal in configuration. No facet arthropathy. No uncovertebral joint disease. No neuroforaminal stenosis. No spinal canal stenosis.   IMPRESSION: 1. 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 Severe foraminal stenoses at these levels. 2. Moderate foraminal stenoses bilaterally at C5-C6 and on the right at C6-C7.     Electronically Signed   By: Clem Savory M.D.   On: 05/10/2023 10:12     Narrative & Impression  CLINICAL DATA:  Chronic back pain   EXAM: MRI THORACIC SPINE WITHOUT CONTRAST   TECHNIQUE: Multiplanar, multisequence MR imaging of the thoracic spine was performed. No intravenous contrast was administered.   COMPARISON:  None Available.   FINDINGS: Alignment:  Physiologic.   Vertebrae: No fracture, evidence of discitis, or  bone lesion. Degenerative endplate marrow changes at several levels. Schmorl's nodes at several levels. Areas of T1/T2 hyperintensity in lumbar vertebrae most consistent with intraosseous hemangioma or focal fat.   Cord:  Normal signal and morphology.   Paraspinal and other soft tissues: Negative.   Disc levels:   Multilevel disc desiccation and bulging.   Mild canal stenoses at T7-T8, T8-T9, T9-T10, and T10-T11 secondary to disc bulging and facet arthropathy. Mild foraminal stenoses throughout the thoracic spine.   IMPRESSION: 1. Mild canal  stenoses at T7-T8, T8-T9, T9-T10, and T10-T11 secondary to disc bulging and facet arthropathy. 2. Mild foraminal stenoses throughout the thoracic spine.     Electronically Signed   By: Clem Savory M.D.   On: 05/10/2023 10:15      I have personally reviewed the images and agree with the above interpretation.  I reviewed her imaging including her MRI and thoracic spine and her MRI of her cervical spine.  She does show multilevel spondylosis with diffuse spondylosis bilaterally.  Notably her scapula are superiorly displaced and likely are interfering with ability to perform x-ray guided injections.  She does have significant stenosis bilaterally at C3-4 and C4-5  Medical Decision Making/Assessment and Plan: Taylor Marshall is a pleasant 73 y.o. female with a clinical presentation consistent with a acute onset of C4 and C5 left-sided radiculopathy versus radiculitis.  She has a history of possible forceps injury as a child and has high riding scapula bilaterally has never had full proximal use of her shoulders.  She was carrying a child and 3 months ago developed significant pain going into her left posterior scalp upper shoulder, and upper periscapular region.  Symptoms are consistent with a radiculitis, likely from a traction type mechanism.  She does have a history/evidence of significant stenosis C3-4 and C4.  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  In regards to her cervical radiculitis, I do feel that she is likely symptomatic from left-sided foraminal stenosis and a traction type mechanism.  Appears to be mostly in the C3-4 and C5 distribution.  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  She is scheduled to undergo a cervical injection CT guidance by Dr. Merriam.  I like to follow-up with her after that to see whether or not she has had significant improvement.  In regards to her scapulothoracic issues, this is likely due to muscle instability as she does appear to have significant trapezius function, but does not have rhomboid supra or infraspinatus function that is of major note.  She may benefit from a figure-of-eight brace, I asked her to discuss this with her rehabilitation team.  She does get some neck soreness and upper mid back soreness from the shoulders and neck region.  Thank you for involving me in the care of this patient.    Taylor MICAEL Sharps MD/MSCR Neurosurgery   I spent approximately 45 minutes on her care today, this included reviewing her chart, going through outside records, reviewing her previous records, reviewing her previous plan.  Face-to-face evaluation, physical examination, coordination of her care going forward, and documentation

## 2023-07-19 ENCOUNTER — Ambulatory Visit
Admission: RE | Admit: 2023-07-19 | Discharge: 2023-07-19 | Disposition: A | Discharge status: Home / Self Care | Source: Ambulatory Visit | Attending: Pulmonary Disease | Admitting: Pulmonary Disease

## 2023-07-19 DIAGNOSIS — R911 Solitary pulmonary nodule: Secondary | ICD-10-CM

## 2023-07-20 NOTE — Progress Notes (Signed)
 Patient for CT guided C7-T1 IL ESI Inj on Friday 07/22/23 by Dr JINNY Chess, I called and spoke with the patient on the phone and gave pre-procedure instructions. Pt was made aware to be here at 9:30a and check in at the Medical [facility] registration desk. Pt stated understanding.  Called 07/15/23

## 2023-07-21 ENCOUNTER — Other Ambulatory Visit: Payer: Self-pay | Admitting: Radiology

## 2023-07-22 ENCOUNTER — Ambulatory Visit
Admission: RE | Admit: 2023-07-22 | Discharge: 2023-07-22 | Disposition: A | Discharge status: Home / Self Care | Source: Ambulatory Visit | Attending: Physical Medicine & Rehabilitation | Admitting: Physical Medicine & Rehabilitation

## 2023-07-22 DIAGNOSIS — M5412 Radiculopathy, cervical region: Secondary | ICD-10-CM | POA: Diagnosis present

## 2023-07-22 MED ORDER — IOHEXOL 300 MG/ML  SOLN
30.0000 mL | Freq: Once | INTRAMUSCULAR | Status: AC | PRN
  Administered 2023-07-22: 3 mL via INTRATHECAL

## 2023-07-22 MED ORDER — DEXAMETHASONE SODIUM PHOSPHATE 10 MG/ML IJ SOLN
10.0000 mg | Freq: Once | INTRAMUSCULAR | Status: AC
  Administered 2023-07-22: 10 mg
  Filled 2023-07-22: qty 1

## 2023-07-22 MED ORDER — DEXAMETHASONE SODIUM PHOSPHATE 10 MG/ML IJ SOLN
INTRAMUSCULAR | Status: AC
  Filled 2023-07-22: qty 1

## 2023-07-22 MED ORDER — LIDOCAINE HCL (PF) 1 % IJ SOLN
10.0000 mL | Freq: Once | INTRAMUSCULAR | Status: AC
  Administered 2023-07-22: 5 mL
  Filled 2023-07-22: qty 10

## 2023-08-19 ENCOUNTER — Ambulatory Visit: Payer: Self-pay | Admitting: Pulmonary Disease

## 2023-08-19 ENCOUNTER — Other Ambulatory Visit: Payer: Self-pay | Admitting: Pulmonary Disease

## 2023-08-19 DIAGNOSIS — I77819 Aortic ectasia, unspecified site: Secondary | ICD-10-CM

## 2023-08-19 NOTE — Telephone Encounter (Signed)
-----   Message from Dedra Sanders sent at 08/19/2023  8:30 AM EDT ----- The previously noted area of concern on her lung is actually a small scar.  This has not changed and is stable.  In this regard she can go back to annual lung cancer screening CT.  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  We can order this CT with  contrast if she wishes to proceed. ----- Message ----- From: Interface, Rad Results In Sent: 07/23/2023  11:55 AM EDT To: Dedra LITTIE Sanders, MD

## 2023-08-19 NOTE — Progress Notes (Signed)
 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  Study ordered.  KYM Leita Sanders, MD Advanced Bronchoscopy [facility]

## 2023-08-19 NOTE — Telephone Encounter (Signed)
 I have notified the patient. She would like you to order the CT with contrast. I did tell her we would get it ordered and Donzell will reach out to her to get it scheduled.  Dr. Tamea- please order the CT.

## 2023-08-19 NOTE — Progress Notes (Signed)
 Study has been ordered

## 2023-08-22 ENCOUNTER — Other Ambulatory Visit: Payer: Self-pay

## 2023-08-22 DIAGNOSIS — I77819 Aortic ectasia, unspecified site: Secondary | ICD-10-CM

## 2023-08-22 DIAGNOSIS — R911 Solitary pulmonary nodule: Secondary | ICD-10-CM

## 2023-08-22 NOTE — Progress Notes (Signed)
 Noted. Nothing further needed.

## 2023-08-23 ENCOUNTER — Other Ambulatory Visit
Admission: RE | Admit: 2023-08-23 | Discharge: 2023-08-23 | Disposition: A | Discharge status: Home / Self Care | Source: Ambulatory Visit | Attending: Pulmonary Disease | Admitting: Pulmonary Disease

## 2023-08-23 ENCOUNTER — Ambulatory Visit
Admission: RE | Admit: 2023-08-23 | Discharge: 2023-08-23 | Disposition: A | Discharge status: Home / Self Care | Source: Ambulatory Visit | Attending: Pulmonary Disease | Admitting: Pulmonary Disease

## 2023-08-23 DIAGNOSIS — I77819 Aortic ectasia, unspecified site: Secondary | ICD-10-CM | POA: Insufficient documentation

## 2023-08-23 LAB — SODIUM: Sodium: 138 mmol/L (ref 135–145)

## 2023-08-23 LAB — HEMATOCRIT: HCT: 42 % (ref 36.0–46.0)

## 2023-08-23 LAB — GLUCOSE, RANDOM: Glucose, Bld: 128 mg/dL — ABNORMAL HIGH (ref 70–99)

## 2023-08-23 LAB — HEMOGLOBIN: Hemoglobin: 13.5 g/dL (ref 12.0–15.0)

## 2023-08-23 LAB — POTASSIUM: Potassium: 3.9 mmol/L (ref 3.5–5.1)

## 2023-08-23 MED ORDER — IOHEXOL 350 MG/ML SOLN
75.0000 mL | Freq: Once | INTRAVENOUS | Status: AC | PRN
  Administered 2023-08-23: 75 mL via INTRAVENOUS

## 2023-09-02 ENCOUNTER — Ambulatory Visit: Payer: Self-pay | Admitting: Pulmonary Disease

## 2023-10-18 ENCOUNTER — Encounter: Payer: Self-pay | Admitting: Student in an Organized Health Care Education/Training Program

## 2023-10-18 ENCOUNTER — Ambulatory Visit
Attending: Student in an Organized Health Care Education/Training Program | Admitting: Student in an Organized Health Care Education/Training Program

## 2023-10-18 VITALS — BP 151/112 | HR 83 | Temp 97.3°F | Resp 18 | Ht 66.0 in | Wt 180.0 lb

## 2023-10-18 DIAGNOSIS — M47812 Spondylosis without myelopathy or radiculopathy, cervical region: Secondary | ICD-10-CM | POA: Insufficient documentation

## 2023-10-18 DIAGNOSIS — M5412 Radiculopathy, cervical region: Secondary | ICD-10-CM | POA: Diagnosis present

## 2023-10-18 DIAGNOSIS — M5481 Occipital neuralgia: Secondary | ICD-10-CM | POA: Diagnosis not present

## 2023-10-18 NOTE — Progress Notes (Signed)
 PROVIDER NOTE: Interpretation of information contained herein should be left to medically-trained personnel. Specific patient instructions are provided elsewhere under Patient Instructions section of medical record. This document was created in part using AI and STT-dictation technology, any transcriptional errors that may result from this process are unintentional.  Patient: Taylor Marshall  Service: E/M Encounter  Provider: Wallie Sherry, MD  DOB: 25-Apr-1950  Delivery: Face-to-face  Specialty: Interventional Pain Management  MRN: [id number]  Setting: Ambulatory outpatient facility  Specialty designation: 09  Type: New Patient  Location: Outpatient office facility  PCP: Sherial Bail, MD  DOS: 10/18/2023    Referring Prov.: Dodson Delon FERNS, MD   Primary Reason(s) for Visit: Encounter for initial evaluation of one or more chronic problems (new to examiner) potentially causing chronic pain, and posing a threat to normal musculoskeletal function. (Level of risk: High) CC: Neck Pain  HPI  Taylor Marshall is a 73 y.o. year old, female patient, who comes for the first time to our practice referred by Dodson Delon I, MD for our initial evaluation of her chronic pain. She has Nicotine dependence due to vaping non-tobacco product; Hiatal hernia; Hepatic steatosis; Left flank pain; H/O adenomatous polyp of colon; Gallbladder sludge; Bright red rectal bleeding; Pharyngitis; Olecranon bursitis of left elbow; COPD suggested by initial evaluation; Cervical radicular pain; and Cervical facet joint syndrome on their problem list. Today she comes in for evaluation of her Neck Pain  Pain Assessment: Location: Left Neck Radiating: to behind left ear and left shoulder Onset: More than a month ago Duration: Chronic pain Quality: Aching, Sharp (pulling, grabbing) Severity: 8 /10 (subjective, self-reported pain score)  Effect on ADL: limits adls - guarded neck ROM - disrupts sleep Timing:  Constant Modifying factors: nothing BP: (!) 151/112  HR: 83  Onset and Duration: Sudden and Present longer than 3 months Cause of pain: Unknown Severity: No change since onset, NAS-11 at its worse: 10/10, NAS-11 at its best: 7/10, NAS-11 now: 7/10, and NAS-11 on the average: 10/10 Timing: Not influenced by the time of the day and During activity or exercise Aggravating Factors: Lifiting, Motion, Twisting, and Walking Alleviating Factors: Nerve blocks Associated Problems: Spasms, Sweating, Pain that wakes patient up, and Pain that does not allow patient to sleep Quality of Pain: Aching, Agonizing, Nagging, Sharp, and Toothache-like Previous Examinations or Tests: CT scan, MRI scan, Spinal tap, and X-rays Previous Treatments: The patient denies any previous treatments  Taylor Marshall is being evaluated for possible interventional pain management therapies for the treatment of her chronic pain.  Discussed the use of AI scribe software for clinical note transcription with the patient, who gave verbal consent to proceed.  History of Present Illness   Taylor Marshall is a 73 year old female with severe degenerative disc disease who presents with chronic neck pain.  She has been experiencing neck pain for approximately five months, which began after carrying her four-year-old grandchild upstairs. The pain is localized to her neck and radiates to her left ear and shoulder, and sometimes up to her head. She describes the sensation as feeling like 'someone is pulling my hair' on the left side, and it is associated with headaches that feel like a 'fuzzy pressure' and dizziness.  She attempted physical therapy but discontinued after one session due to severe pain. She has been prescribed gabapentin, with the dose increased to 600 mg, taken four times a day. She also received an epidural injection in July 2025, which was not effective in alleviating her  symptoms.  She denies any arm pain. She reports  pain wrapping around the back of her scalp and affecting her left ear.  No pain is reported on the right side of her neck. She is not currently on any blood thinners.       Meds   Current Outpatient Medications:    gabapentin (NEURONTIN) 300 MG capsule, Take 300 mg by mouth 3 (three) times daily., Disp: , Rfl:    ibuprofen (ADVIL) 200 MG tablet, Take 600 mg by mouth 3 (three) times daily as needed., Disp: , Rfl:    omeprazole (PRILOSEC) 20 MG capsule, Take 20 mg by mouth daily., Disp: , Rfl:    albuterol  (VENTOLIN  HFA) 108 (90 Base) MCG/ACT inhaler, Inhale 2 puffs into the lungs every 6 (six) hours as needed for wheezing or shortness of breath. (Patient not taking: Reported on 10/18/2023), Disp: 8.5 g, Rfl: 2  Imaging Review  Cervical Imaging: Cervical MR wo contrast: Results for orders placed during the hospital encounter of 04/15/23  MR CERVICAL SPINE WO CONTRAST  Narrative CLINICAL DATA:  Chronic neck pain  EXAM: MRI CERVICAL SPINE WITHOUT CONTRAST  TECHNIQUE: Multiplanar, multisequence MR imaging of the cervical spine was performed. No intravenous contrast was administered.  COMPARISON:  None Available.  FINDINGS: Alignment: Physiologic.  Vertebrae: Degenerative endplate marrow changes at a few levels. Partial interbody fusion at C7-T1. Areas of T1/T2 hyperintensity in lumbar vertebrae most consistent with intraosseous hemangioma or focal fat. No acute fracture is identified.  Cord: Normal signal and morphology.  Posterior Fossa, vertebral arteries, paraspinal tissues: The visualized portions of the skull base and the posterior fossa are normal. No soft tissue abnormality is identified.  Disc levels:  C2-C3: The disk is normal in configuration. Mild bilateral facet arthropathy. No uncovertebral joint disease. No neuroforaminal stenosis. No spinal canal stenosis.  C3-C4: Disc osteophyte complex. Mild bilateral facet arthropathy. Severe bilateral uncovertebral  joint disease. Severe bilateral neuroforaminal stenosis. Moderate spinal canal stenosis.  C4-C5: Disc osteophyte complex. Mild bilateral facet arthropathy. Severe bilateral uncovertebral joint disease. Severe bilateral neuroforaminal stenosis. Moderate spinal canal stenosis.  C5-C6: Disc osteophyte complex. Mild bilateral facet arthropathy. Moderate bilateral uncovertebral joint disease. Moderate bilateral neuroforaminal stenosis. No spinal canal stenosis.  C6-C7: The disk is normal in configuration. Mild bilateral facet arthropathy. Moderate right uncovertebral joint disease. Moderate right neuroforaminal stenosis. No spinal canal stenosis.  C7-T1: The disk is normal in configuration. No facet arthropathy. No uncovertebral joint disease. No neuroforaminal stenosis. No spinal canal stenosis.  IMPRESSION: 1. 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 Severe foraminal stenoses at these levels. 2. Moderate foraminal stenoses bilaterally at C5-C6 and on the right at C6-C7.   Electronically Signed By: Clem Savory M.D. On: 05/10/2023 10:12  DG Cervical Spine Complete  Narrative CLINICAL DATA:  Left neck pain, fell 2 weeks ago  EXAM: CERVICAL SPINE - COMPLETE 4+ VIEW  COMPARISON:  01/01/2020  FINDINGS: Frontal, bilateral oblique, lateral views of the cervical spine are obtained. Mild right convex cervical scoliosis, otherwise alignment is anatomic to the C7 level. Cervicothoracic junction is suboptimally visualized due to overlying structures. Stable extensive multilevel spondylosis and facet hypertrophy greatest from C3-4 through C6-7. No evidence of acute fracture. Evaluation of the left neural foramina is limited due to patient positioning. There is mild diffuse right-sided neural foraminal encroachment greatest at the C5-6 level. Prevertebral soft tissues are unremarkable. Lung apices are clear.  IMPRESSION: 1. Extensive  multilevel cervical spondylosis and facet hypertrophy.  2. Right convex cervical scoliosis. 3. No acute fracture.   Electronically Signed By: Ozell Daring M.D. On: 11/30/2022 10:16   MR THORACIC SPINE WO CONTRAST  Narrative CLINICAL DATA:  Chronic back pain  EXAM: MRI THORACIC SPINE WITHOUT CONTRAST  TECHNIQUE: Multiplanar, multisequence MR imaging of the thoracic spine was performed. No intravenous contrast was administered.  COMPARISON:  None Available.  FINDINGS: Alignment:  Physiologic.  Vertebrae: No fracture, evidence of discitis, or bone lesion. Degenerative endplate marrow changes at several levels. Schmorl's nodes at several levels. Areas of T1/T2 hyperintensity in lumbar vertebrae most consistent with intraosseous hemangioma or focal fat.  Cord:  Normal signal and morphology.  Paraspinal and other soft tissues: Negative.  Disc levels:  Multilevel disc desiccation and bulging.  Mild canal stenoses at T7-T8, T8-T9, T9-T10, and T10-T11 secondary to disc bulging and facet arthropathy. Mild foraminal stenoses throughout the thoracic spine.  IMPRESSION: 1. Mild canal stenoses at T7-T8, T8-T9, T9-T10, and T10-T11 secondary to disc bulging and facet arthropathy. 2. Mild foraminal stenoses throughout the thoracic spine.   Electronically Signed By: Clem Savory M.D. On: 05/10/2023 10:15   Narrative EXAM: CT-guided Cervical Interlaminar Epidural Steroid Injection  CLINICAL HISTORY: Cervical radiculitis.  COMPARISON:  TECHNIQUE: Informed consent was obtained from the patient. The details of the procedure including benefits and risks were discussed and all questions answered. Following a pre-procedural timeout, the patient was placed in the prone position and planning images were acquired. An appropriate site was selected and the skin marked. The skin was prepped and draped with aseptic technique. Approximately 1-2 mL of 1% lidocaine  was  administered subcutaneously for local anesthesia.  A 3.5-inch, 22-gauge spinal needle was advanced under intermittent CT fluoroscopic guidance into the C6-C7 interlaminar epidural space via a midline approach. No blood or CSF was aspirated. After initial injection of contrast, split epidural and intrathecal injection was observed. After needle repositioning, injection of approximately 0.2 mL of Omnipaque -300 contrast material confirmed epidural needle placement. No vascular uptake was observed. Subsequently, a mixture of 10 mg Decadron  and 0.5 mL 1% lidocaine  was injected. The needle was removed and a sterile bandage applied to the injection site.  FINDINGS: Technically successful injection without immediate postprocedural complication.  Pre-procedural Pain Score: 7/10  Post-procedural Pain Score: 5/10  IMPRESSION: 1. Technically successful CT-guided C6-7 interlaminar epidural steroid injection, midline.  Electronically signed by: Ryan Chess MD 07/22/2023 11:55 AM EDT RP Workstation: [id number]  D  Complexity Note: Imaging results reviewed.                         ROS  Cardiovascular: No reported cardiovascular signs or symptoms such as High blood pressure, coronary artery disease, abnormal heart rate or rhythm, heart attack, blood thinner therapy or heart weakness and/or failure Pulmonary or Respiratory: Shortness of breath and Smoking Neurological: No reported neurological signs or symptoms such as seizures, abnormal skin sensations, urinary and/or fecal incontinence, being born with an abnormal open spine and/or a tethered spinal cord Psychological-Psychiatric: No reported psychological or psychiatric signs or symptoms such as difficulty sleeping, anxiety, depression, delusions or hallucinations (schizophrenial), mood swings (bipolar disorders) or suicidal ideations or attempts Gastrointestinal: No reported gastrointestinal signs or symptoms such as vomiting or evacuating  blood, reflux, heartburn, alternating episodes of diarrhea and constipation, inflamed or scarred liver, or pancreas or irrregular and/or infrequent bowel movements Genitourinary: No reported renal or genitourinary signs or symptoms such as difficulty voiding or producing urine, peeing blood, non-functioning kidney, kidney  stones, difficulty emptying the bladder, difficulty controlling the flow of urine, or chronic kidney disease Hematological: No reported hematological signs or symptoms such as prolonged bleeding, low or poor functioning platelets, bruising or bleeding easily, hereditary bleeding problems, low energy levels due to low hemoglobin or being anemic Endocrine: No reported endocrine signs or symptoms such as high or low blood sugar, rapid heart rate due to high thyroid  levels, obesity or weight gain due to slow thyroid  or thyroid  disease Rheumatologic: No reported rheumatological signs and symptoms such as fatigue, joint pain, tenderness, swelling, redness, heat, stiffness, decreased range of motion, with or without associated rash Musculoskeletal: Negative for myasthenia gravis, muscular dystrophy, multiple sclerosis or malignant hyperthermia Work History: Retired  Allergies  Taylor Marshall is allergic to egg solids, whole; morphine  and codeine; and buprenorphine hcl.  Laboratory Chemistry Profile   Renal Lab Results  Component Value Date   BUN 12 09/28/2021   CREATININE 0.75 09/28/2021   GFRAA >60 11/27/2014   GFRNONAA >60 09/28/2021   SPECGRAV 1.010 11/15/2014   PHUR 5.0 11/15/2014   PROTEINUR NEGATIVE 09/28/2021     Electrolytes Lab Results  Component Value Date   NA 138 08/23/2023   K 3.9 08/23/2023   CL 108 09/28/2021   CALCIUM 9.6 09/28/2021   PHOS 3.3 11/27/2014     Hepatic Lab Results  Component Value Date   AST 20 09/28/2021   ALT 13 09/28/2021   ALBUMIN 4.1 09/28/2021   ALKPHOS 88 09/28/2021   AMYLASE 84 11/27/2014   LIPASE 32 09/28/2021     ID Lab  Results  Component Value Date   SARSCOV2NAA NEGATIVE 01/29/2021     Bone No results found for: VD25OH, VD125OH2TOT, CI6874NY7, CI7874NY7, 25OHVITD1, 25OHVITD2, 25OHVITD3, TESTOFREE, TESTOSTERONE   Endocrine Lab Results  Component Value Date   GLUCOSE 128 (H) 08/23/2023   GLUCOSEU NEGATIVE 09/28/2021     Neuropathy No results found for: VITAMINB12, FOLATE, HGBA1C, HIV   CNS No results found for: COLORCSF, APPEARCSF, RBCCOUNTCSF, WBCCSF, POLYSCSF, LYMPHSCSF, EOSCSF, PROTEINCSF, GLUCCSF, JCVIRUS, CSFOLI, IGGCSF, LABACHR, ACETBL   Inflammation (CRP: Acute  ESR: Chronic) No results found for: CRP, ESRSEDRATE, LATICACIDVEN   Rheumatology No results found for: RF, ANA, LABURIC, URICUR, LYMEIGGIGMAB, LYMEABIGMQN, HLAB27   Coagulation Lab Results  Component Value Date   PLT 274 09/28/2021   LABHEMA Note: 02/05/2021     Cardiovascular Lab Results  Component Value Date   HGB 13.5 08/23/2023   HCT 42.0 08/23/2023     Screening Lab Results  Component Value Date   SARSCOV2NAA NEGATIVE 01/29/2021     Cancer No results found for: CEA, CA125, LABCA2   Allergens No results found for: ALMOND, APPLE, ASPARAGUS, AVOCADO, BANANA, BARLEY, BASIL, BAYLEAF, GREENBEAN, LIMABEAN, WHITEBEAN, BEEFIGE, REDBEET, BLUEBERRY, BROCCOLI, CABBAGE, MELON, CARROT, CASEIN, CASHEWNUT, CAULIFLOWER, CELERY     Note: Lab results reviewed.  PFSH  Drug: Taylor Marshall  reports no history of drug use. Alcohol:  reports no history of alcohol use. Tobacco:  reports that she has quit smoking. Her smoking use included e-cigarettes and cigarettes. She has a 20 pack-year smoking history. She has never used smokeless tobacco. Medical:  has a past medical history of Arthritis and GERD (gastroesophageal reflux disease). Family: family history includes Atrial fibrillation in her father; Cancer in  her mother; Diabetes in her mother; Heart disease in her father; Heart failure in her father; Hypertension in her father and mother.  Past Surgical History:  Procedure Laterality Date   BREAST EXCISIONAL BIOPSY Right  COLONOSCOPY  2006?   Dr Chipper   VAGINAL HYSTERECTOMY     Active Ambulatory Problems    Diagnosis Date Noted   Nicotine dependence due to vaping non-tobacco product 02/15/2021   Hiatal hernia 03/29/2021   Hepatic steatosis 03/24/2021   Left flank pain 02/07/2013   H/O adenomatous polyp of colon 03/29/2021   Gallbladder sludge 02/10/2021   Bright red rectal bleeding 02/10/2021   Pharyngitis 12/01/2021   Olecranon bursitis of left elbow 12/16/2022   COPD suggested by initial evaluation 01/04/2023   Cervical radicular pain 07/18/2023   Cervical facet joint syndrome 10/18/2023   Resolved Ambulatory Problems    Diagnosis Date Noted   No Resolved Ambulatory Problems   Past Medical History:  Diagnosis Date   Arthritis    GERD (gastroesophageal reflux disease)    Constitutional Exam  General appearance: Well nourished, well developed, and well hydrated. In no apparent acute distress Vitals:   10/18/23 1023  BP: (!) 151/112  Pulse: 83  Resp: 18  Temp: (!) 97.3 F (36.3 C)  SpO2: 100%  Weight: 180 lb (81.6 kg)  Height: 5' 6 (1.676 m)   BMI Assessment: Estimated body mass index is 29.05 kg/m as calculated from the following:   Height as of this encounter: 5' 6 (1.676 m).   Weight as of this encounter: 180 lb (81.6 kg).  BMI interpretation table: BMI level Category Range association with higher incidence of chronic pain  <18 kg/m2 Underweight   18.5-24.9 kg/m2 Ideal body weight   25-29.9 kg/m2 Overweight Increased incidence by 20%  30-34.9 kg/m2 Obese (Class I) Increased incidence by 68%  35-39.9 kg/m2 Severe obesity (Class II) Increased incidence by 136%  >40 kg/m2 Extreme obesity (Class III) Increased incidence by 254%   Patient's current BMI Ideal  Body weight  Body mass index is 29.05 kg/m. Ideal body weight: 59.3 kg (130 lb 11.7 oz) Adjusted ideal body weight: 68.2 kg (150 lb 7 oz)   BMI Readings from Last 4 Encounters:  10/18/23 29.05 kg/m  07/18/23 28.51 kg/m  04/14/23 28.51 kg/m  01/11/23 27.63 kg/m   Wt Readings from Last 4 Encounters:  10/18/23 180 lb (81.6 kg)  07/18/23 182 lb (82.6 kg)  04/14/23 182 lb (82.6 kg)  01/11/23 176 lb 6.4 oz (80 kg)    Psych/Mental status: Alert, oriented x 3 (person, place, & time)       Eyes: PERLA Respiratory: No evidence of acute respiratory distress  Cervical Spine Area Exam  Skin & Axial Inspection: No masses, redness, edema, swelling, or associated skin lesions Alignment: Symmetrical Functional ROM: Pain restricted ROM, to the left Stability: No instability detected Muscle Tone/Strength: Functionally intact. No obvious neuro-muscular anomalies detected. Sensory (Neurological): Musculoskeletal pain pattern Palpation: No palpable anomalies             Left occipital neuralgia also noted  5 out of 5 strength bilateral upper extremity: Shoulder abduction, elbow flexion, elbow extension, thumb extension.  Assessment  Primary Diagnosis & Pertinent Problem List: The primary encounter diagnosis was Cervical spondylosis (left). Diagnoses of Cervical facet joint syndrome (LEFT), Cervical radicular pain, and Occipital neuralgia of left side were also pertinent to this visit.  Visit Diagnosis (New problems to examiner): 1. Cervical spondylosis (left)   2. Cervical facet joint syndrome (LEFT)   3. Cervical radicular pain   4. Occipital neuralgia of left side    Plan of Care (Initial workup plan)  Assessment and Plan    Left cervical facet arthropathy and degenerative  disc disease with associated cervicalgia   Chronic neck pain persists for five months, worsened by carrying her grandchild, with pain radiating to the left shoulder and head. She has severe degenerative disc disease  and facet joint disease at C3, C4, C5, and C6. A previous epidural injection was ineffective.  She does have cervical degenerative disc disease and facet arthropathy. Taylor Marshall has a history of greater than 3 months of moderate to severe pain which is resulted in functional impairment.  The patient has tried various conservative therapeutic options such as NSAIDs, Tylenol, muscle relaxants, physical therapy, cervical epidural steroid injection which were inadequately effective.  Patient's pain is predominantly axial with physical exam and cervical MRI findings suggestive of cervical facet arthropathy. Cervical facet medial branch nerve blocks were discussed with the patient.  Risks and benefits were reviewed.  Patient would like to proceed with LEFT C3, 4, C5, C6, medial branch nerve block.  Left occipital neuralgia   Pain in the back of the head, wrapping around to the left ear, is consistent with occipital neuralgia. Perform an occipital nerve block on the left side.  Headache associated with cervical and occipital nerve pathology   Headaches are described as fuzzy pressure with dizziness, likely related to cervical and occipital nerve pathology.        Procedure Orders         CERVICAL FACET (MEDIAL BRANCH NERVE BLOCK)          GREATER OCCIPITAL NERVE BLOCK       Provider-requested follow-up: Return in about 22 days (around 11/09/2023) for LEFT C3, 4, 5, 6 MBNB + Left GONB, in clinic IV Versed.  Future Appointments  Date Time Provider Department Center  10/19/2023  1:30 PM Claudene Penne ORN, MD [facility]   I discussed the assessment and treatment plan with the patient. The patient was provided an opportunity to ask questions and all were answered. The patient agreed with the plan and demonstrated an understanding of the instructions.  I personally spent a total of 60 minutes in the care of the patient today including preparing to see the patient, getting/reviewing separately  obtained history, performing a medically appropriate exam/evaluation, counseling and educating, placing orders, and documenting clinical information in the EHR.   Note by: Wallie Sherry, MD (TTS and AI technology used. I apologize for any typographical errors that were not detected and corrected.) Date: 10/18/2023; Time: 2:04 PM

## 2023-10-18 NOTE — Patient Instructions (Signed)

## 2023-10-18 NOTE — Progress Notes (Signed)
 Safety precautions to be maintained throughout the outpatient stay will include: orient to surroundings, keep bed in low position, maintain call bell within reach at all times, provide assistance with transfer out of bed and ambulation.

## 2023-10-19 ENCOUNTER — Ambulatory Visit: Admitting: Neurosurgery

## 2023-10-19 VITALS — BP 130/72 | Ht 66.75 in | Wt 181.5 lb

## 2023-10-19 DIAGNOSIS — M5412 Radiculopathy, cervical region: Secondary | ICD-10-CM | POA: Diagnosis not present

## 2023-10-19 DIAGNOSIS — S43396D Dislocation of other parts of unspecified shoulder girdle, subsequent encounter: Secondary | ICD-10-CM

## 2023-11-06 DIAGNOSIS — S43396D Dislocation of other parts of unspecified shoulder girdle, subsequent encounter: Secondary | ICD-10-CM | POA: Insufficient documentation

## 2023-11-06 DIAGNOSIS — M5412 Radiculopathy, cervical region: Secondary | ICD-10-CM | POA: Insufficient documentation

## 2023-11-06 NOTE — Progress Notes (Addendum)
 Referring Physician:  Sherial Bail, MD [address] [place],  [place] [address]  Primary Physician:  Sherial Bail, MD  History of Present Illness: 10/19/23 [name] is here today with a chief complaint of neck pain radiating into her posterior scalp and left shoulder.  7 approximately 3 months ago after she was lifting a child.  Notably she does have a history of scapular abnormalities.  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  She has never had full use of her proximal upper extremities.  She states that she has not noticed any acute worsening of her upper extremities but that the pain on the left side of her scalp upper shoulder has been quite significant.  She feels it going into her neck and head whenever she moves or turns her head.  She is not getting significant symptoms going down the left arm past the shoulder.  She has been working on injection based care. Is here today to follow up with limited improvement.  Conservative measures:   Multimodal medical therapy including regular antiinflammatories: tylenol, gabaoentin, meloxicam , methocarbamol Injections: recent ct guided injections with no relief  Past Surgery: no spinal surgeries  The symptoms are causing a significant impact on the patient's life.   I have utilized the care everywhere function in epic to review the outside records available from external health systems.  Review of Systems:  A 10 point review of systems is negative, except for the pertinent positives and negatives detailed in the HPI.  Past Medical History: Past Medical History:  Diagnosis Date   Arthritis    GERD (gastroesophageal reflux disease)     Past Surgical History: Past Surgical History:  Procedure Laterality Date   BREAST EXCISIONAL BIOPSY Right    COLONOSCOPY  2006?   Dr Chipper   VAGINAL HYSTERECTOMY      Allergies: Allergies as of  10/19/2023 - Review Complete 10/18/2023  Allergen Reaction Noted   Egg solids, whole Diarrhea and Nausea Only 04/12/2023   Morphine  and codeine Nausea Only 11/27/2014   Buprenorphine hcl Nausea Only 12/16/2014    Medications:  Current Outpatient Medications:    albuterol  (VENTOLIN  HFA) 108 (90 Base) MCG/ACT inhaler, Inhale 2 puffs into the lungs every 6 (six) hours as needed for wheezing or shortness of breath., Disp: 8.5 g, Rfl: 2   amLODipine (NORVASC) 2.5 MG tablet, Take 2.5 mg by mouth daily., Disp: , Rfl:    gabapentin (NEURONTIN) 300 MG capsule, Take 600 mg by mouth 2 (two) times daily., Disp: , Rfl:    ibuprofen (ADVIL) 200 MG tablet, Take 600 mg by mouth 3 (three) times daily as needed., Disp: , Rfl:    meloxicam  (MOBIC ) 15 MG tablet, Take 15 mg by mouth daily as needed., Disp: , Rfl:    omeprazole (PRILOSEC) 20 MG capsule, Take 20 mg by mouth daily., Disp: , Rfl:   Social History: Social History   Tobacco Use   Smoking status: Former    Current packs/day: 1.00    Average packs/day: 1 pack/day for 20.0 years (20.0 ttl pk-yrs)    Types: E-cigarettes, Cigarettes   Smokeless tobacco: Never   Tobacco comments:    Quit cigarettes 2019    Uses vape (no nicotine per patient)  Vaping Use   Vaping status: Some Days  Substance Use Topics   Alcohol use: No    Alcohol/week: 0.0 standard drinks of alcohol   Drug use:  No    Family Medical History: Family History  Problem Relation Age of Onset   Cancer Mother    Hypertension Mother    Diabetes Mother    Hypertension Father    Heart failure Father    Heart disease Father    Atrial fibrillation Father    Breast cancer Neg Hx     Physical Examination: Vitals:   10/19/23 1257  BP: 130/72    General: Patient is in no apparent distress. Attention to examination is appropriate.  Neck:   Supple.  Full range of motion.  Respiratory: Patient is breathing without any difficulty.   NEUROLOGICAL:     Awake, alert,  oriented to person, place, and time.  Speech is clear and fluent.   Cranial Nerves: Pupils equal round and reactive to light.  Facial tone is symmetric.  Facial sensation is symmetric. Shoulder shrug is symmetric. Tongue protrusion is midline.    Strength: Her proximal motor grading is somewhat difficult to give formal numbering 2 given her scapular abnormalities.  She has high riding scapula bilaterally.  She does have significant supra and infraspinatus wasting.  She also has minimal rhomboid mass.  She does activate her deltoids bilaterally, appears she can get out to at least 90 degrees, notably her scapula do not rotate internally full range of motion is quite limited.  She has weakness in external rotation bilaterally.  She has good handgrip and elbow flexion.  Imaging: Narrative & Impression  CLINICAL DATA:  Chronic neck pain   EXAM: MRI CERVICAL SPINE WITHOUT CONTRAST   TECHNIQUE: Multiplanar, multisequence MR imaging of the cervical spine was performed. No intravenous contrast was administered.   COMPARISON:  None Available.   FINDINGS: Alignment: Physiologic.   Vertebrae: Degenerative endplate marrow changes at a few levels. Partial interbody fusion at C7-T1. Areas of T1/T2 hyperintensity in lumbar vertebrae most consistent with intraosseous hemangioma or focal fat. No acute fracture is identified.   Cord: Normal signal and morphology.   Posterior Fossa, vertebral arteries, paraspinal tissues: The visualized portions of the skull base and the posterior fossa are normal. No soft tissue abnormality is identified.   Disc levels:   C2-C3: The disk is normal in configuration. Mild bilateral facet arthropathy. No uncovertebral joint disease. No neuroforaminal stenosis. No spinal canal stenosis.   C3-C4: Disc osteophyte complex. Mild bilateral facet arthropathy. Severe bilateral uncovertebral joint disease. Severe bilateral neuroforaminal stenosis. Moderate spinal canal  stenosis.   C4-C5: Disc osteophyte complex. Mild bilateral facet arthropathy. Severe bilateral uncovertebral joint disease. Severe bilateral neuroforaminal stenosis. Moderate spinal canal stenosis.   C5-C6: Disc osteophyte complex. Mild bilateral facet arthropathy. Moderate bilateral uncovertebral joint disease. Moderate bilateral neuroforaminal stenosis. No spinal canal stenosis.   C6-C7: The disk is normal in configuration. Mild bilateral facet arthropathy. Moderate right uncovertebral joint disease. Moderate right neuroforaminal stenosis. No spinal canal stenosis.   C7-T1: The disk is normal in configuration. No facet arthropathy. No uncovertebral joint disease. No neuroforaminal stenosis. No spinal canal stenosis.   IMPRESSION: 1. 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 Severe foraminal stenoses at these levels. 2. Moderate foraminal stenoses bilaterally at C5-C6 and on the right at C6-C7.     Electronically Signed   By: Clem Savory M.D.   On: 05/10/2023 10:12     Narrative & Impression  CLINICAL DATA:  Chronic back pain   EXAM: MRI THORACIC SPINE WITHOUT CONTRAST   TECHNIQUE: Multiplanar, multisequence MR imaging of the thoracic spine was  performed. No intravenous contrast was administered.   COMPARISON:  None Available.   FINDINGS: Alignment:  Physiologic.   Vertebrae: No fracture, evidence of discitis, or bone lesion. Degenerative endplate marrow changes at several levels. Schmorl's nodes at several levels. Areas of T1/T2 hyperintensity in lumbar vertebrae most consistent with intraosseous hemangioma or focal fat.   Cord:  Normal signal and morphology.   Paraspinal and other soft tissues: Negative.   Disc levels:   Multilevel disc desiccation and bulging.   Mild canal stenoses at T7-T8, T8-T9, T9-T10, and T10-T11 secondary to disc bulging and facet arthropathy. Mild foraminal stenoses throughout the  thoracic spine.   IMPRESSION: 1. Mild canal stenoses at T7-T8, T8-T9, T9-T10, and T10-T11 secondary to disc bulging and facet arthropathy. 2. Mild foraminal stenoses throughout the thoracic spine.     Electronically Signed   By: Clem Savory M.D.   On: 05/10/2023 10:15      I have personally reviewed the images and agree with the above interpretation.  I reviewed her imaging including her MRI and thoracic spine and her MRI of her cervical spine.  She does show multilevel spondylosis with diffuse spondylosis bilaterally.  Notably her scapula are superiorly displaced and likely are interfering with ability to perform x-ray guided injections.  She does have significant stenosis bilaterally at C3-4 and C4-5  Medical Decision Making/Assessment and Plan: Ms. Wardrop is a pleasant 73 y.o. female with a clinical presentation consistent with a acute onset of C4 and C5 left-sided radiculopathy versus radiculitis.  She has a history of possible forceps injury as a child and has high riding scapula bilaterally has never had full proximal use of her shoulders.  She was carrying a child and 3 months ago developed significant pain going into her left posterior scalp upper shoulder, and upper periscapular region.  Symptoms are consistent with a radiculitis, likely from a traction type mechanism.  She does have a history/evidence of significant stenosis C3-4 and C4.  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  She tried CT guided injections with very limited to no improvement. She was seen by Dr. Marcelino yesterday and he is planning on facet injections.   At this point I agree continuing with nonsurgical care until all her options are  exhausted. I think she has a high chance of getting some relief with facet injections and look forward to seeing the result.   Thank you for involving me in the care of this patient.    Penne MICAEL Sharps MD/MSCR Neurosurgery

## 2023-11-21 ENCOUNTER — Other Ambulatory Visit: Payer: Self-pay | Admitting: Student in an Organized Health Care Education/Training Program

## 2023-11-21 ENCOUNTER — Ambulatory Visit (HOSPITAL_BASED_OUTPATIENT_CLINIC_OR_DEPARTMENT_OTHER): Admitting: Student in an Organized Health Care Education/Training Program

## 2023-11-21 ENCOUNTER — Ambulatory Visit
Admission: RE | Admit: 2023-11-21 | Discharge: 2023-11-21 | Disposition: A | Discharge status: Home / Self Care | Source: Ambulatory Visit | Attending: Student in an Organized Health Care Education/Training Program | Admitting: Student in an Organized Health Care Education/Training Program

## 2023-11-21 VITALS — BP 122/98 | HR 80 | Temp 97.0°F | Resp 16 | Ht 60.0 in | Wt 181.0 lb

## 2023-11-21 DIAGNOSIS — M47812 Spondylosis without myelopathy or radiculopathy, cervical region: Secondary | ICD-10-CM | POA: Diagnosis not present

## 2023-11-21 DIAGNOSIS — G894 Chronic pain syndrome: Secondary | ICD-10-CM | POA: Diagnosis present

## 2023-11-21 DIAGNOSIS — M5481 Occipital neuralgia: Secondary | ICD-10-CM

## 2023-11-21 MED ORDER — ROPIVACAINE HCL 2 MG/ML IJ SOLN
18.0000 mL | Freq: Once | INTRAMUSCULAR | Status: AC
  Administered 2023-11-21: 18 mL via PERINEURAL

## 2023-11-21 MED ORDER — ROPIVACAINE HCL 2 MG/ML IJ SOLN
INTRAMUSCULAR | Status: AC
  Filled 2023-11-21: qty 20

## 2023-11-21 MED ORDER — MIDAZOLAM HCL (PF) 2 MG/2ML IJ SOLN
0.5000 mg | Freq: Once | INTRAMUSCULAR | Status: AC
  Administered 2023-11-21: 2 mg via INTRAVENOUS

## 2023-11-21 MED ORDER — LIDOCAINE HCL 2 % IJ SOLN
20.0000 mL | Freq: Once | INTRAMUSCULAR | Status: AC
  Administered 2023-11-21: 400 mg

## 2023-11-21 MED ORDER — FENTANYL CITRATE (PF) 100 MCG/2ML IJ SOLN
INTRAMUSCULAR | Status: AC
  Filled 2023-11-21: qty 2

## 2023-11-21 MED ORDER — DEXAMETHASONE SOD PHOSPHATE PF 10 MG/ML IJ SOLN
20.0000 mg | Freq: Once | INTRAMUSCULAR | Status: AC
  Administered 2023-11-21: 20 mg

## 2023-11-21 MED ORDER — MIDAZOLAM HCL 2 MG/2ML IJ SOLN
INTRAMUSCULAR | Status: AC
  Filled 2023-11-21: qty 2

## 2023-11-21 MED ORDER — LIDOCAINE HCL 2 % IJ SOLN
INTRAMUSCULAR | Status: AC
  Filled 2023-11-21: qty 20

## 2023-11-21 MED ORDER — ROPIVACAINE HCL 2 MG/ML IJ SOLN
INTRAMUSCULAR | Status: AC
Start: 2023-11-21 — End: 2023-11-21
  Filled 2023-11-21: qty 20

## 2023-11-21 MED ORDER — MIDAZOLAM HCL 5 MG/5ML IJ SOLN
INTRAMUSCULAR | Status: AC
  Filled 2023-11-21: qty 5

## 2023-11-21 MED ORDER — LACTATED RINGERS IV SOLN: Freq: Once | INTRAVENOUS | Status: DC

## 2023-11-21 NOTE — Progress Notes (Signed)
 Safety precautions to be maintained throughout the outpatient stay will include: orient to surroundings, keep bed in low position, maintain call bell within reach at all times, provide assistance with transfer out of bed and ambulation.

## 2023-11-21 NOTE — Progress Notes (Signed)
 PROVIDER NOTE: Interpretation of information contained herein should be left to medically-trained personnel. Specific patient instructions are provided elsewhere under Patient Instructions section of medical record. This document was created in part using STT-dictation technology, any transcriptional errors that may result from this process are unintentional.  Patient: Taylor Marshall Type: Established DOB: 1950/12/08 MRN: [id number] PCP: Sherial Bail, MD  Service: Procedure DOS: 11/21/2023 Setting: Ambulatory Location: Ambulatory outpatient facility Delivery: Face-to-face Provider: Wallie Sherry, MD Specialty: Interventional Pain Management Specialty designation: 09 Location: Outpatient facility Ref. Prov.: Sherry Wallie, MD       Interventional Therapy   Procedure: Cervical Facet Medial Branch Block(s) #1  Laterality: Left  Level: C3, C4, C5, and C6 Medial Branch Level(s). Injecting these levels blocks the C3-4, C4-5, and C5-6 cervical facet joints.  Imaging: Fluoroscopic guidance Anesthesia: Local anesthesia (1-2% Lidocaine )            Sedation: Minimal Sedation                       DOS: 11/21/2023  Performed by: Wallie Sherry, MD  Purpose: Diagnostic/Therapeutic Indications: Cervicalgia (cervical spine axial pain) severe enough to impact quality of life or function. 1. Cervical spondylosis (left)   2. Cervical facet joint syndrome (LEFT)    NAS-11 Pain score:   Pre-procedure: 7 /10   Post-procedure: 0-No pain/10     Position / Prep / Materials:  Position: Prone. Head in cradle. C-spine slightly flexed. Prep solution: ChloraPrep (2% chlorhexidine gluconate and 70% isopropyl alcohol) Prep Area: Posterior Cervico-thoracic Region. From occipital ridge to tip of scapula, and from shoulder to shoulder. Entire posterior and lateral neck surface. Materials:  Tray: Block Needle(s):  Type: Spinal  Gauge (G): 22  Length: 3.5-in  Qty:  3     H&P (Pre-op Assessment):   Ms. Joos is a 73 y.o. (year old), female patient, seen today for interventional treatment. She  has a past surgical history that includes Vaginal hysterectomy; Colonoscopy (2006?); and Breast excisional biopsy (Right). Ms. Molnar has a current medication list which includes the following prescription(s): albuterol , amlodipine, gabapentin, ibuprofen, meloxicam , and omeprazole, and the following Facility-Administered Medications: lactated ringers . Her primarily concern today is the Neck Pain  Initial Vital Signs:  Pulse/HCG Rate: 95ECG Heart Rate: 83 Temp: (!) 97 F (36.1 C) Resp: 16 BP: (!) 142/97 SpO2: 97 %  BMI: Estimated body mass index is 35.35 kg/m as calculated from the following:   Height as of this encounter: 5' (1.524 m).   Weight as of this encounter: 181 lb (82.1 kg).  Risk Assessment: Allergies: Reviewed. She is allergic to egg solids, whole; morphine  and codeine; and buprenorphine hcl.  Allergy Precautions: None required Coagulopathies: Reviewed. None identified.  Blood-thinner therapy: None at this time Active Infection(s): Reviewed. None identified. Ms. Jorden is afebrile  Site Confirmation: Ms. Kinner was asked to confirm the procedure and laterality before marking the site Procedure checklist: Completed Consent: Before the procedure and under the influence of no sedative(s), amnesic(s), or anxiolytics, the patient was informed of the treatment options, risks and possible complications. To fulfill our ethical and legal obligations, as recommended by the American Medical Association's Code of Ethics, I have informed the patient of my clinical impression; the nature and purpose of the treatment or procedure; the risks, benefits, and possible complications of the intervention; the alternatives, including doing nothing; the risk(s) and benefit(s) of the alternative treatment(s) or procedure(s); and the risk(s) and benefit(s) of doing nothing. The patient was provided  information about the general risks and possible complications associated with the procedure. These may include, but are not limited to: failure to achieve desired goals, infection, bleeding, organ or nerve damage, allergic reactions, paralysis, and death. In addition, the patient was informed of those risks and complications associated to Spine-related procedures, such as failure to decrease pain; infection (i.e.: Meningitis, epidural or intraspinal abscess); bleeding (i.e.: epidural hematoma, subarachnoid hemorrhage, or any other type of intraspinal or peri-dural bleeding); organ or nerve damage (i.e.: Any type of peripheral nerve, nerve root, or spinal cord injury) with subsequent damage to sensory, motor, and/or autonomic systems, resulting in permanent pain, numbness, and/or weakness of one or several areas of the body; allergic reactions; (i.e.: anaphylactic reaction); and/or death. Furthermore, the patient was informed of those risks and complications associated with the medications. These include, but are not limited to: allergic reactions (i.e.: anaphylactic or anaphylactoid reaction(s)); adrenal axis suppression; blood sugar elevation that in diabetics may result in ketoacidosis or comma; water retention that in patients with history of congestive heart failure may result in shortness of breath, pulmonary edema, and decompensation with resultant heart failure; weight gain; swelling or edema; medication-induced neural toxicity; particulate matter embolism and blood vessel occlusion with resultant organ, and/or nervous system infarction; and/or aseptic necrosis of one or more joints. Finally, the patient was informed that Medicine is not an exact science; therefore, there is also the possibility of unforeseen or unpredictable risks and/or possible complications that may result in a catastrophic outcome. The patient indicated having understood very clearly. We have given the patient no guarantees and we  have made no promises. Enough time was given to the patient to ask questions, all of which were answered to the patient's satisfaction. Ms. Kibble has indicated that she wanted to continue with the procedure. Attestation: I, the ordering provider, attest that I have discussed with the patient the benefits, risks, side-effects, alternatives, likelihood of achieving goals, and potential problems during recovery for the procedure that I have provided informed consent. Date  Time: 11/21/2023  7:53 AM  Pre-Procedure Preparation:  Monitoring: As per clinic protocol. Respiration, ETCO2, SpO2, BP, heart rate and rhythm monitor placed and checked for adequate function Safety Precautions: Patient was assessed for positional comfort and pressure points before starting the procedure. Time-out: I initiated and conducted the Time-out before starting the procedure, as per protocol. The patient was asked to participate by confirming the accuracy of the Time Out information. Verification of the correct person, site, and procedure were performed and confirmed by me, the nursing staff, and the patient. Time-out conducted as per Joint Commission's Universal Protocol (UP.01.01.01). Time: 0842 Start Time: 0842 hrs.  Description/Narrative of Procedure:          Laterality: See above. Targeted Levels: See above.  Rationale (medical necessity): procedure needed and proper for the diagnosis and/or treatment of the patient's medical symptoms and needs. Procedural Technique Safety Precautions: Aspiration looking for blood return was conducted prior to all injections. At no point did we inject any substances, as a needle was being advanced. No attempts were made at seeking any paresthesias. Safe injection practices and needle disposal techniques used. Medications properly checked for expiration dates. SDV (single dose vial) medications used. Description of the Procedure: Protocol guidelines were followed. The patient  was assisted into a comfortable position. The target area was identified and the area prepped in the usual manner. Skin & deeper tissues infiltrated with local anesthetic. Appropriate amount of time allowed to pass for local anesthetics  to take effect. The procedure needles were then advanced to the target area. Proper needle placement secured. Negative aspiration confirmed. Solution injected in intermittent fashion, asking for systemic symptoms every 0.5cc of injectate. The needles were then removed and the area cleansed, making sure to leave some of the prepping solution back to take advantage of its long term bactericidal properties.  Technical description of process:   C3 Medial Branch Nerve Block (MBB): The target area for the C3 dorsal medial articular branch is the lateral concave waist of the articular pillar of C3. Under fluoroscopic guidance, a Quincke needle was inserted until contact was made with os over the postero-lateral aspect of the articular pillar of C3 (target area). After negative aspiration for blood, 2mL of the nerve block solution was injected without difficulty or complication. The needle was removed intact. C4 Medial Branch Nerve Block (MBB): The target area for the C4 dorsal medial articular branch is the lateral concave waist of the articular pillar of C4. Under fluoroscopic guidance, a Quincke needle was inserted until contact was made with os over the postero-lateral aspect of the articular pillar of C4 (target area). After negative aspiration for blood, 2mL of the nerve block solution was injected without difficulty or complication. The needle was removed intact. C5 Medial Branch Nerve Block (MBB): The target area for the C5 dorsal medial articular branch is the lateral concave waist of the articular pillar of C5. Under fluoroscopic guidance, a Quincke needle was inserted until contact was made with os over the postero-lateral aspect of the articular pillar of C5 (target area).  After negative aspiration for blood, 2mL of the nerve block solution was injected without difficulty or complication. The needle was removed intact. C6 Medial Branch Nerve Block (MBB): The target area for the C6 dorsal medial articular branch is the lateral concave waist of the articular pillar of C6. Under fluoroscopic guidance, a Quincke needle was inserted until contact was made with os over the postero-lateral aspect of the articular pillar of C6 (target area). After negative aspiration for blood, 2mL of the nerve block solution was injected without difficulty or complication. The needle was removed intact.  Once the entire procedure was completed, the treated area was cleaned, making sure to leave some of the prepping solution back to take advantage of its long term bactericidal properties.  Anatomy Reference Guide:      Facet Joint Innervation  C1-2 Third occipital Nerve (TON)  C2-3 TON, C3  Medial Branch  C3-4 C3, C4                     C4-5 C4, C5                     C5-6 C5, C6                     C6-7 C6, C7                     C7-T1 C7, C8                      Cervical Facet Pain Pattern overlap:   Vitals:   11/21/23 0847 11/21/23 0852 11/21/23 0856 11/21/23 0900  BP: (!) 147/115 (!) 133/97 (!) 151/106 (!) 122/98  Pulse:    80  Resp: 16 12 14 16   Temp:    (!) 97 F (36.1 C)  SpO2: 97% 98% 96% 99%  Weight:  Height:         Start Time: 0842 hrs. End Time: 0854 hrs.  Imaging Guidance (Spinal):         Type of Imaging Technique: Fluoroscopy Guidance (Spinal) Indication(s): Fluoroscopy guidance for needle placement to enhance accuracy in procedures requiring precise needle localization for targeted delivery of medication in or near specific anatomical locations not easily accessible without such real-time imaging assistance. Exposure Time: Please see nurses notes. Contrast: None used. Fluoroscopic Guidance: I was personally present during the use of  fluoroscopy. Tunnel Vision Technique used to obtain the best possible view of the target area. Parallax error corrected before commencing the procedure. Direction-depth-direction technique used to introduce the needle under continuous pulsed fluoroscopy. Once target was reached, antero-posterior, oblique, and lateral fluoroscopic projection used confirm needle placement in all planes. Images permanently stored in EMR. Interpretation: No contrast injected. I personally interpreted the imaging intraoperatively. Adequate needle placement confirmed in multiple planes. Permanent images saved into the patient's record.  Post-operative Assessment:  Post-procedure Vital Signs:  Pulse/HCG Rate: 8078 Temp: (!) 97 F (36.1 C) Resp: 16 BP: (!) 122/98 SpO2: 99 %  EBL: None  Complications: No immediate post-treatment complications observed by team, or reported by patient.  Note: The patient tolerated the entire procedure well. A repeat set of vitals were taken after the procedure and the patient was kept under observation following institutional policy, for this type of procedure. Post-procedural neurological assessment was performed, showing return to baseline, prior to discharge. The patient was provided with post-procedure discharge instructions, including a section on how to identify potential problems. Should any problems arise concerning this procedure, the patient was given instructions to immediately contact us , at any time, without hesitation. In any case, we plan to contact the patient by telephone for a follow-up status report regarding this interventional procedure.  Comments:  No additional relevant information.  Plan of Care (POC)  Orders:  No orders of the defined types were placed in this encounter.    Medications ordered for procedure: Meds ordered this encounter  Medications   lidocaine  (XYLOCAINE ) 2 % (with pres) injection 400 mg   lactated ringers  infusion   midazolam PF  (VERSED) injection 0.5-2 mg    Make sure Flumazenil is available in the pyxis when using this medication. If oversedation occurs, administer 0.2 mg IV over 15 sec. If after 45 sec no response, administer 0.2 mg again over 1 min; may repeat at 1 min intervals; not to exceed 4 doses (1 mg)   ropivacaine (PF) 2 mg/mL (0.2%) (NAROPIN) injection 18 mL   dexamethasone  (DECADRON ) injection 20 mg   Medications administered: We administered lidocaine , midazolam PF, ropivacaine (PF) 2 mg/mL (0.2%), and dexamethasone .  See the medical record for exact dosing, route, and time of administration.    Left C3,4,5,6 MBNB + Left GONB 11/21/23    Follow-up plan:   Return in about 4 weeks (around 12/19/2023) for PPE, F2F, Seema.     Recent Visits Date Type Provider Dept  10/18/23 Office Visit Marcelino Nurse, MD [facility]  Showing recent visits within past 90 days and meeting all other requirements Today's Visits Date Type Provider Dept  11/21/23 Procedure visit Marcelino Nurse, MD [facility]  Showing today's visits and meeting all other requirements Future Appointments Date Type Provider Dept  12/19/23 Appointment Patel, Seema K, NP [facility]  Showing future appointments within next 90 days and meeting all other requirements   Disposition: Discharge home  Discharge (Date  Time): 11/21/2023; 0903 hrs.   Primary Care Physician: Sherial Bail, MD Location: [facility] Note by: Wallie Sherry, MD (TTS technology used. I apologize for any typographical errors that were not detected and corrected.) Date: 11/21/2023; Time: 10:04 AM  Disclaimer:  Medicine is not an visual merchandiser. The only guarantee in medicine is that nothing is guaranteed. It is important to note that the decision to proceed with this intervention was based on the information collected from the patient. The Data and conclusions were drawn from the patient's questionnaire,  the interview, and the physical examination. Because the information was provided in large part by the patient, it cannot be guaranteed that it has not been purposely or unconsciously manipulated. Every effort has been made to obtain as much relevant data as possible for this evaluation. It is important to note that the conclusions that lead to this procedure are derived in large part from the available data. Always take into account that the treatment will also be dependent on availability of resources and existing treatment guidelines, considered by other Pain Management Practitioners as being common knowledge and practice, at the time of the intervention. For Medico-Legal purposes, it is also important to point out that variation in procedural techniques and pharmacological choices are the acceptable norm. The indications, contraindications, technique, and results of the above procedure should only be interpreted and judged by a Board-Certified Interventional Pain Specialist with extensive familiarity and expertise in the same exact procedure and technique.

## 2023-11-21 NOTE — Progress Notes (Signed)
 PROVIDER NOTE: Interpretation of information contained herein should be left to medically-trained personnel. Specific patient instructions are provided elsewhere under Patient Instructions section of medical record. This document was created in part using STT-dictation technology, any transcriptional errors that may result from this process are unintentional.  Patient: Taylor Marshall Type: Established DOB: November 28, 1950 MRN: [id number] PCP: Sherial Bail, MD  Service: Procedure DOS: 11/21/2023 Setting: Ambulatory Location: Ambulatory outpatient facility Delivery: Face-to-face Provider: Wallie Sherry, MD Specialty: Interventional Pain Management Specialty designation: 09 Location: Outpatient facility Ref. Prov.: Sherry Wallie, MD       Interventional Therapy   Primary Reason for Visit: Interventional Pain Management Treatment. CC: Neck Pain    Procedure:          Anesthesia, Analgesia, Anxiolysis:  Type: Diagnostic, Greater, Occipital Nerve Block           Region: Posterolateral Cervical Level: Occipital Ridge   Laterality: Left  Anesthesia: Local (1-2% Lidocaine )  Anxiolysis: IV Versed            Position: Prone   1. Cervical spondylosis (left)   2. Cervical facet joint syndrome (LEFT)   3. Occipital neuralgia of left side    NAS-11 Pain score:   Pre-procedure: 7 /10   Post-procedure: 0-No pain/10     H&P (Pre-op Assessment):  Taylor Marshall is a 73 y.o. (year old), female patient, seen today for interventional treatment. She  has a past surgical history that includes Vaginal hysterectomy; Colonoscopy (2006?); and Breast excisional biopsy (Right). Taylor Marshall has a current medication list which includes the following prescription(s): albuterol , amlodipine, gabapentin, ibuprofen, meloxicam , and omeprazole, and the following Facility-Administered Medications: lactated ringers . Her primarily concern today is the Neck Pain  Initial Vital Signs:  Pulse/HCG Rate: 95ECG Heart  Rate: 83 Temp: (!) 97 F (36.1 C) Resp: 16 BP: (!) 142/97 SpO2: 97 %  BMI: Estimated body mass index is 35.35 kg/m as calculated from the following:   Height as of this encounter: 5' (1.524 m).   Weight as of this encounter: 181 lb (82.1 kg).  Risk Assessment: Allergies: Reviewed. She is allergic to egg solids, whole; morphine  and codeine; and buprenorphine hcl.  Allergy Precautions: None required Coagulopathies: Reviewed. None identified.  Blood-thinner therapy: None at this time Active Infection(s): Reviewed. None identified. Taylor Marshall is afebrile  Site Confirmation: Taylor Marshall was asked to confirm the procedure and laterality before marking the site Procedure checklist: Completed Consent: Before the procedure and under the influence of no sedative(s), amnesic(s), or anxiolytics, the patient was informed of the treatment options, risks and possible complications. To fulfill our ethical and legal obligations, as recommended by the American Medical Association's Code of Ethics, I have informed the patient of my clinical impression; the nature and purpose of the treatment or procedure; the risks, benefits, and possible complications of the intervention; the alternatives, including doing nothing; the risk(s) and benefit(s) of the alternative treatment(s) or procedure(s); and the risk(s) and benefit(s) of doing nothing. The patient was provided information about the general risks and possible complications associated with the procedure. These may include, but are not limited to: failure to achieve desired goals, infection, bleeding, organ or nerve damage, allergic reactions, paralysis, and death. In addition, the patient was informed of those risks and complications associated to the procedure, such as failure to decrease pain; infection; bleeding; organ or nerve damage with subsequent damage to sensory, motor, and/or autonomic systems, resulting in permanent pain, numbness, and/or weakness  of one or several areas of the  body; allergic reactions; (i.e.: anaphylactic reaction); and/or death. Furthermore, the patient was informed of those risks and complications associated with the medications. These include, but are not limited to: allergic reactions (i.e.: anaphylactic or anaphylactoid reaction(s)); adrenal axis suppression; blood sugar elevation that in diabetics may result in ketoacidosis or comma; water retention that in patients with history of congestive heart failure may result in shortness of breath, pulmonary edema, and decompensation with resultant heart failure; weight gain; swelling or edema; medication-induced neural toxicity; particulate matter embolism and blood vessel occlusion with resultant organ, and/or nervous system infarction; and/or aseptic necrosis of one or more joints. Finally, the patient was informed that Medicine is not an exact science; therefore, there is also the possibility of unforeseen or unpredictable risks and/or possible complications that may result in a catastrophic outcome. The patient indicated having understood very clearly. We have given the patient no guarantees and we have made no promises. Enough time was given to the patient to ask questions, all of which were answered to the patient's satisfaction. Taylor Marshall has indicated that she wanted to continue with the procedure. Attestation: I, the ordering provider, attest that I have discussed with the patient the benefits, risks, side-effects, alternatives, likelihood of achieving goals, and potential problems during recovery for the procedure that I have provided informed consent. Date  Time: 11/21/2023  7:53 AM  Pre-Procedure Preparation:  Monitoring: As per clinic protocol. Respiration, ETCO2, SpO2, BP, heart rate and rhythm monitor placed and checked for adequate function Safety Precautions: Patient was assessed for positional comfort and pressure points before starting the procedure. Time-out:  I initiated and conducted the Time-out before starting the procedure, as per protocol. The patient was asked to participate by confirming the accuracy of the Time Out information. Verification of the correct person, site, and procedure were performed and confirmed by me, the nursing staff, and the patient. Time-out conducted as per Joint Commission's Universal Protocol (UP.01.01.01). Time: 0842 Start Time: 0842 hrs.  Description of Procedure:          Target Area: Area medial to the occipital artery at the level of the superior nuchal ridge Approach: Posterior approach Area Prepped: Entire Posterior Occipital Region ChloraPrep (2% chlorhexidine gluconate and 70% isopropyl alcohol) Safety Precautions: Aspiration looking for blood return was conducted prior to all injections. At no point did we inject any substances, as a needle was being advanced. No attempts were made at seeking any paresthesias. Safe injection practices and needle disposal techniques used. Medications properly checked for expiration dates. SDV (single dose vial) medications used. Description of the Procedure: Protocol guidelines were followed. The target area was identified and the area prepped in the usual manner. Skin & deeper tissues infiltrated with local anesthetic. Appropriate amount of time allowed to pass for local anesthetics to take effect. The procedure needles were then advanced to the target area. Proper needle placement secured. Negative aspiration confirmed. Solution injected in intermittent fashion, asking for systemic symptoms every 0.5cc of injectate. The needles were then removed and the area cleansed, making sure to leave some of the prepping solution back to take advantage of its long term bactericidal properties.  Vitals:   11/21/23 0847 11/21/23 0852 11/21/23 0856 11/21/23 0900  BP: (!) 147/115 (!) 133/97 (!) 151/106 (!) 122/98  Pulse:    80  Resp: 16 12 14 16   Temp:    (!) 97 F (36.1 C)  SpO2: 97%  98% 96% 99%  Weight:      Height:  Start Time: 0842 hrs. End Time: 0854 hrs. Materials:   5 cc solution made of 4cc of 0.2% ropivacaine, 1 cc of Decadron  10 mg/cc.   Antibiotic Prophylaxis:   Anti-infectives (From admission, onward)    None      Indication(s): None identified  Post-operative Assessment:  Post-procedure Vital Signs:  Pulse/HCG Rate: 8078 Temp: (!) 97 F (36.1 C) Resp: 16 BP: (!) 122/98 SpO2: 99 %  EBL: None  Complications: No immediate post-treatment complications observed by team, or reported by patient.  Note: The patient tolerated the entire procedure well. A repeat set of vitals were taken after the procedure and the patient was kept under observation following institutional policy, for this type of procedure. Post-procedural neurological assessment was performed, showing return to baseline, prior to discharge. The patient was provided with post-procedure discharge instructions, including a section on how to identify potential problems. Should any problems arise concerning this procedure, the patient was given instructions to immediately contact us , at any time, without hesitation. In any case, we plan to contact the patient by telephone for a follow-up status report regarding this interventional procedure.  Comments:  No additional relevant information.  Plan of Care (POC)  Orders:  No orders of the defined types were placed in this encounter.   Medications ordered for procedure: Meds ordered this encounter  Medications   lidocaine  (XYLOCAINE ) 2 % (with pres) injection 400 mg   lactated ringers  infusion   midazolam PF (VERSED) injection 0.5-2 mg    Make sure Flumazenil is available in the pyxis when using this medication. If oversedation occurs, administer 0.2 mg IV over 15 sec. If after 45 sec no response, administer 0.2 mg again over 1 min; may repeat at 1 min intervals; not to exceed 4 doses (1 mg)   ropivacaine (PF) 2 mg/mL (0.2%)  (NAROPIN) injection 18 mL   dexamethasone  (DECADRON ) injection 20 mg   Medications administered: We administered lidocaine , midazolam PF, ropivacaine (PF) 2 mg/mL (0.2%), and dexamethasone .  See the medical record for exact dosing, route, and time of administration.    Follow-up plan:   Return in about 4 weeks (around 12/19/2023) for PPE, F2F, Seema.     Recent Visits Date Type Provider Dept  10/18/23 Office Visit Marcelino Nurse, MD [facility]  Showing recent visits within past 90 days and meeting all other requirements Today's Visits Date Type Provider Dept  11/21/23 Procedure visit Marcelino Nurse, MD [facility]  Showing today's visits and meeting all other requirements Future Appointments Date Type Provider Dept  12/19/23 Appointment Patel, Seema K, NP [facility]  Showing future appointments within next 90 days and meeting all other requirements   Disposition: Discharge home  Discharge (Date  Time): 11/21/2023; 0903 hrs.   Primary Care Physician: Sherial Bail, MD Location: [facility] Note by: Nurse Marcelino, MD (TTS technology used. I apologize for any typographical errors that were not detected and corrected.) Date: 11/21/2023; Time: 10:05 AM  Disclaimer:  Medicine is not an visual merchandiser. The only guarantee in medicine is that nothing is guaranteed. It is important to note that the decision to proceed with this intervention was based on the information collected from the patient. The Data and conclusions were drawn from the patient's questionnaire, the interview, and the physical examination. Because the information was provided in large part by the patient, it cannot be guaranteed that it has not been purposely or unconsciously manipulated. Every effort has been made to obtain as  much relevant data as possible for this evaluation. It is important to note that the conclusions that lead to this procedure are  derived in large part from the available data. Always take into account that the treatment will also be dependent on availability of resources and existing treatment guidelines, considered by other Pain Management Practitioners as being common knowledge and practice, at the time of the intervention. For Medico-Legal purposes, it is also important to point out that variation in procedural techniques and pharmacological choices are the acceptable norm. The indications, contraindications, technique, and results of the above procedure should only be interpreted and judged by a Board-Certified Interventional Pain Specialist with extensive familiarity and expertise in the same exact procedure and technique.

## 2023-11-21 NOTE — Patient Instructions (Addendum)

## 2023-11-22 ENCOUNTER — Telehealth: Payer: Self-pay | Admitting: *Deleted

## 2023-11-22 NOTE — Telephone Encounter (Signed)
 No problems post procedure.

## 2023-12-12 ENCOUNTER — Encounter: Payer: Self-pay | Admitting: Neurosurgery

## 2023-12-12 ENCOUNTER — Ambulatory Visit: Admitting: Neurosurgery

## 2023-12-12 ENCOUNTER — Other Ambulatory Visit: Payer: Self-pay

## 2023-12-12 ENCOUNTER — Ambulatory Visit: Payer: Self-pay | Admitting: Neurosurgery

## 2023-12-12 VITALS — BP 148/112 | Ht 60.0 in | Wt 181.0 lb

## 2023-12-12 DIAGNOSIS — M5412 Radiculopathy, cervical region: Secondary | ICD-10-CM

## 2023-12-12 DIAGNOSIS — M9902 Segmental and somatic dysfunction of thoracic region: Secondary | ICD-10-CM

## 2023-12-12 DIAGNOSIS — M4802 Spinal stenosis, cervical region: Secondary | ICD-10-CM

## 2023-12-12 DIAGNOSIS — M4722 Other spondylosis with radiculopathy, cervical region: Secondary | ICD-10-CM | POA: Diagnosis not present

## 2023-12-12 DIAGNOSIS — Z01818 Encounter for other preprocedural examination: Secondary | ICD-10-CM

## 2023-12-12 NOTE — Progress Notes (Signed)
 Referring Physician:  Sherial Bail, MD [address] [place],  [place] [address]  Primary Physician:  Sherial Bail, MD  Discussed the use of AI scribe software for clinical note transcription with the patient, who gave verbal consent to proceed.  History of Present Illness Taylor Marshall is a 73 year old female with cervical radiculopathy who presents with worsening neck pain and muscle spasms. She has severe neck pain radiating to the left head, ear, and shoulder, with occasional right-sided pain. Pain is associated with random neck muscle spasms, worsened by movement, and she often holds her head down to limit spasms. She underwent a procedure on November 21, 2023 targeting the C3-C4 and C4-C5 nerves, which improved symptoms for about 1.5 days before the neck pain returned with greater intensity. She has difficulty breathing when lying on her back and episodes of food getting stuck while swallowing, and she is planned for a barium swallow study to evaluate these symptoms. She has had prior physical therapy and injections for her neck, most recently about 1.5 months ago, with only temporary benefit. Conservative measures:   Multimodal medical therapy including regular antiinflammatories: tylenol, gabaoentin, meloxicam , methocarbamol Injections: recent ct guided injections with no relief  Past Surgery: no spinal surgeries  The symptoms are causing a significant impact on the patient's life.   I have utilized the care everywhere function in epic to review the outside records available from external health systems.  Review of Systems:  A 10 point review of systems is negative, except for the pertinent positives and negatives detailed in the HPI.  Past Medical History: Past Medical History:  Diagnosis Date   Arthritis    GERD (gastroesophageal reflux disease)     Past Surgical History: Past Surgical History:  Procedure Laterality Date   BREAST EXCISIONAL  BIOPSY Right    COLONOSCOPY  2006?   Dr Chipper   VAGINAL HYSTERECTOMY      Allergies: Allergies as of 12/12/2023 - Review Complete 12/12/2023  Allergen Reaction Noted   Egg solids, whole Diarrhea and Nausea Only 04/12/2023   Morphine  and codeine Nausea Only 11/27/2014   Buprenorphine hcl Nausea Only 12/16/2014    Medications:  Current Outpatient Medications:    albuterol  (VENTOLIN  HFA) 108 (90 Base) MCG/ACT inhaler, Inhale 2 puffs into the lungs every 6 (six) hours as needed for wheezing or shortness of breath., Disp: 8.5 g, Rfl: 2   DULoxetine (CYMBALTA) 30 MG capsule, Take 30 mg by mouth daily., Disp: , Rfl:    gabapentin (NEURONTIN) 300 MG capsule, Take 600 mg by mouth 2 (two) times daily., Disp: , Rfl:    ibuprofen (ADVIL) 200 MG tablet, Take 600 mg by mouth 3 (three) times daily as needed., Disp: , Rfl:    omeprazole (PRILOSEC) 20 MG capsule, Take 20 mg by mouth daily., Disp: , Rfl:    amLODipine (NORVASC) 2.5 MG tablet, Take 2.5 mg by mouth daily. (Patient not taking: Reported on 12/12/2023), Disp: , Rfl:   Social History: Social History   Tobacco Use   Smoking status: Former    Current packs/day: 1.00    Average packs/day: 1 pack/day for 20.0 years (20.0 ttl pk-yrs)    Types: E-cigarettes, Cigarettes   Smokeless tobacco: Never   Tobacco comments:    Quit cigarettes 2019    Uses vape (no nicotine per patient)  Vaping Use   Vaping status: Some Days  Substance Use Topics   Alcohol use: No    Alcohol/week: 0.0 standard drinks  of alcohol   Drug use: No    Family Medical History: Family History  Problem Relation Age of Onset   Cancer Mother    Hypertension Mother    Diabetes Mother    Hypertension Father    Heart failure Father    Heart disease Father    Atrial fibrillation Father    Breast cancer Neg Hx     Physical Examination: Vitals:   12/12/23 1430  BP: (!) 148/112    General: Patient is in no apparent distress. Attention to examination is  appropriate.  Neck:   Supple.  Full range of motion.  Respiratory: Patient is breathing without any difficulty.   NEUROLOGICAL:     Awake, alert, oriented to person, place, and time.  Speech is clear and fluent.   Cranial Nerves: Pupils equal round and reactive to light.  Facial tone is symmetric.  Facial sensation is symmetric. Shoulder shrug is symmetric. Tongue protrusion is midline.    Strength: Her proximal motor grading is somewhat difficult to give formal numbering 2 given her scapular abnormalities.  She has high riding scapula bilaterally.  She does have significant supra and infraspinatus wasting.  She also has minimal rhomboid mass.  She does activate her deltoids bilaterally, appears she can get out to at least 90 degrees, notably her scapula do not rotate internally full range of motion is quite limited.  She has weakness in external rotation bilaterally.  She has good handgrip and elbow flexion.  Imaging: Narrative & Impression  CLINICAL DATA:  Chronic neck pain   EXAM: MRI CERVICAL SPINE WITHOUT CONTRAST   TECHNIQUE: Multiplanar, multisequence MR imaging of the cervical spine was performed. No intravenous contrast was administered.   COMPARISON:  None Available.   FINDINGS: Alignment: Physiologic.   Vertebrae: Degenerative endplate marrow changes at a few levels. Partial interbody fusion at C7-T1. Areas of T1/T2 hyperintensity in lumbar vertebrae most consistent with intraosseous hemangioma or focal fat. No acute fracture is identified.   Cord: Normal signal and morphology.   Posterior Fossa, vertebral arteries, paraspinal tissues: The visualized portions of the skull base and the posterior fossa are normal. No soft tissue abnormality is identified.   Disc levels:   C2-C3: The disk is normal in configuration. Mild bilateral facet arthropathy. No uncovertebral joint disease. No neuroforaminal stenosis. No spinal canal stenosis.   C3-C4: Disc osteophyte  complex. Mild bilateral facet arthropathy. Severe bilateral uncovertebral joint disease. Severe bilateral neuroforaminal stenosis. Moderate spinal canal stenosis.   C4-C5: Disc osteophyte complex. Mild bilateral facet arthropathy. Severe bilateral uncovertebral joint disease. Severe bilateral neuroforaminal stenosis. Moderate spinal canal stenosis.   C5-C6: Disc osteophyte complex. Mild bilateral facet arthropathy. Moderate bilateral uncovertebral joint disease. Moderate bilateral neuroforaminal stenosis. No spinal canal stenosis.   C6-C7: The disk is normal in configuration. Mild bilateral facet arthropathy. Moderate right uncovertebral joint disease. Moderate right neuroforaminal stenosis. No spinal canal stenosis.   C7-T1: The disk is normal in configuration. No facet arthropathy. No uncovertebral joint disease. No neuroforaminal stenosis. No spinal canal stenosis.   IMPRESSION: 1. 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 Severe foraminal stenoses at these levels. 2. Moderate foraminal stenoses bilaterally at C5-C6 and on the right at C6-C7.     Electronically Signed   By: Clem Savory M.D.   On: 05/10/2023 10:12      I have personally reviewed the images and agree with the above interpretation.  Assessment and Plan Assessment & Plan Cervical spondylosis with  left-sided radiculopathy Chronic cervical spondylosis with left-sided radiculopathy, primarily affecting C3-C4 and C4-C5 foramina. Symptoms include neck pain, spasms, and radiation to the head, ear, and shoulder. Previous injection provided temporary relief. Current symptoms suggest nerve compression and cervical radiculopathy. Surgical intervention considered due to persistent symptoms and impact on quality of life. Risks of anterior approach include potential swallowing issues, hence preference for posterior approach. The procedure involves decompression of the foramina to  relieve nerve pressure.  Her MRI shows severe stenosis at C3-C5 bilaterally but her symptoms are left side predominant.  - Scheduled posterior cervical foraminotomy to decompress C3-C4 and C4-C5 foramina, risks and benefits were discussed, patient would like to go forward with the surgery. - Discussed potential need for anterior surgery if posterior approach is insufficient.  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

## 2023-12-12 NOTE — Patient Instructions (Signed)
 Please see below for information in regards to your upcoming surgery:   Planned surgery: C3-5 Posterior Cervical Foraminotomies, Minimally Invasive (MIS)   Surgery date: 01/17/24 at [facility] (Medical Mall: [address] [place], [place] [address]) - you will find out your arrival time the business day before your surgery.   Pre-op appointment at [facility] Pre-admit Testing: you will receive a call with a date/time for this appointment. If you are scheduled for an in person appointment, Pre-admit Testing is located on the first floor of the [facility] building, [address], [address]. During this appointment, they will advise you which medications you can take the morning of surgery, and which medications you will need to hold for surgery. Labs (such as blood work, EKG) may be done at your pre-op appointment. You are not required to fast for these labs. Should you need to change your pre-op appointment, please call Pre-admit testing at [phone].     Surgical clearance: we will send a clearance form to Dr. Lavenia Beaver. They may wish to see you in their office prior to signing the clearance form. If so, they may call you to schedule an appointment.    Common restrictions after spine surgery: No bending, lifting, or twisting ("BLT"). Avoid lifting objects heavier than 10 pounds for the first 6 weeks after surgery. Where possible, avoid household activities that involve lifting, bending, reaching, pushing, or pulling such as laundry, vacuuming, grocery shopping, and childcare. Try to arrange for help from friends and family for these activities while you heal. Do not drive while taking prescription pain medication. Weeks 6 through 12 after surgery: avoid lifting more than 25 pounds.     How to contact us :  If you have any questions/concerns before or after surgery, you can reach us  at [phone], or you can send a mychart message. We can  be reached by phone or mychart 8am-4pm, Monday-Friday.  *Please note: Calls after 4pm are forwarded to a third party answering service. Mychart messages are not routinely monitored during evenings, weekends, and holidays. Please call our office to contact the answering service for urgent concerns during non-business hours.     If you have FMLA/disability paperwork, please drop it off or fax it to [phone]   Appointments/FMLA & disability paperwork: Reche Hait, & Nichole Registered Nurse/Surgery scheduler: Kendelyn, RN & Katie, RN Certified Medical Assistants: Don, CMA, Elenor, CMA, Damien, CMA, & Auston, [place] Physician Assistants: Lyle Decamp, PA-C, Edsel Goods, PA-C & Glade Boys, PA-C Surgeons: Penne Sharps, MD & Reeves Daisy, MD    [facility] PREADMIT TESTING VISIT and SURGERY INFORMATION SHEET   Now that surgery has been scheduled you can anticipate several phone calls from [facility] services. A pharmacy technician will call you to verify your current list of medications taken at home.               The Pre-Service Center will call to verify your insurance information and to give you billing estimates and information.             The Preadmit Testing Office will be calling to schedule a visit to obtain information for the anesthesia team and provide instructions on preparation for surgery.  What can you expect for the Preadmit Testing Visit: Appointments may be scheduled in-person or by telephone.  If a telephone visit is scheduled, you may be asked to come into the office to have lab tests or other studies performed.  This visit will not be completed any greater than 14 days prior to your surgery.  If your surgery has been scheduled for a future date, please do not be alarmed if we have not contacted you to schedule an appointment more than a month prior to the surgery date.    Please be prepared to provide the following information  during this appointment:            -Personal medical history                                               -Medication and allergy list            -Any history of problems with anesthesia              -Recent lab work or diagnostic studies            -Please notify us  of any needs we should be aware of to provide the best care possible           -You will be provided with instructions on how to prepare for your surgery.    On The Day of Surgery:  You must have a driver to take you home after surgery, you will be asked not to drive for 24 hours following surgery.  Taxi, Gisele and non-medical transport will not be acceptable means of transportation unless you have a responsible individual who will be traveling with you.  Visitors in the surgical area:   2 people will be able to visit you in your room once your preparation for surgery has been completed. During surgery, your visitors will be asked to wait in the Surgery Waiting Area.  It is not a requirement for them to stay, if they prefer to leave and come back.  Your visitor(s) will be given an update once the surgery has been completed.  No visitors are allowed in the initial recovery room to respect patient privacy and safety.  Once you are more awake and transfer to the secondary recovery area, or are transferred to an inpatient room, visitors will again be able to see you.  To respect and protect your privacy: We will ask on the day of surgery who your driver will be and what the contact number for that individual will be. We will ask if it is okay to share information with this individual, or if there is an alternative individual that we, or the surgeon, should contact to provide updates and information. If family or friends come to the surgical information desk requesting information about you, who you have not listed with us , no information will be given.   It may be helpful to designate someone as the main contact who will be  responsible for updating your other friends and family.    PREADMIT TESTING OFFICE: [phone] SAME DAY SURGERY: [phone] We look forward to caring for you before and throughout the process of your surgery.

## 2023-12-19 ENCOUNTER — Ambulatory Visit: Admitting: Nurse Practitioner

## 2023-12-19 DIAGNOSIS — G894 Chronic pain syndrome: Secondary | ICD-10-CM

## 2023-12-19 DIAGNOSIS — Z91199 Patient's noncompliance with other medical treatment and regimen due to unspecified reason: Secondary | ICD-10-CM

## 2023-12-19 NOTE — Progress Notes (Signed)
 12/19/2023-No Show

## 2024-01-03 ENCOUNTER — Other Ambulatory Visit: Payer: Self-pay | Admitting: Internal Medicine

## 2024-01-03 DIAGNOSIS — R1314 Dysphagia, pharyngoesophageal phase: Secondary | ICD-10-CM

## 2024-01-03 DIAGNOSIS — I10 Essential (primary) hypertension: Secondary | ICD-10-CM

## 2024-01-04 ENCOUNTER — Telehealth: Payer: Self-pay

## 2024-01-04 NOTE — Telephone Encounter (Signed)
 I received the following secure chat: Patient is scheduled for surgery on 01/17/24 and has a barium swallow on 01/16/2024. She wants to know if this is okay to have done the day before surgery?   Per discussion with Dr Claudene, OK to proceed with barium swallow the day before surgery.  Reche Ghazi notified the patient of Dr Theressa response.

## 2024-01-09 ENCOUNTER — Other Ambulatory Visit: Payer: Self-pay

## 2024-01-09 ENCOUNTER — Encounter
Admission: RE | Admit: 2024-01-09 | Discharge: 2024-01-09 | Disposition: A | Discharge status: Home / Self Care | Payer: Medicare (Managed Care) | Source: Ambulatory Visit | Attending: Neurosurgery | Admitting: Neurosurgery

## 2024-01-09 DIAGNOSIS — Z01818 Encounter for other preprocedural examination: Secondary | ICD-10-CM | POA: Diagnosis present

## 2024-01-09 DIAGNOSIS — Z01812 Encounter for preprocedural laboratory examination: Secondary | ICD-10-CM

## 2024-01-09 DIAGNOSIS — M5412 Radiculopathy, cervical region: Secondary | ICD-10-CM | POA: Diagnosis not present

## 2024-01-09 DIAGNOSIS — Z01810 Encounter for preprocedural cardiovascular examination: Secondary | ICD-10-CM | POA: Diagnosis not present

## 2024-01-09 HISTORY — DX: Myoneural disorder, unspecified: G70.9

## 2024-01-09 LAB — CBC
HCT: 42 % (ref 36.0–46.0)
Hemoglobin: 13.5 g/dL (ref 12.0–15.0)
MCH: 26.1 pg (ref 26.0–34.0)
MCHC: 32.1 g/dL (ref 30.0–36.0)
MCV: 81.2 fL (ref 80.0–100.0)
Platelets: 236 K/uL (ref 150–400)
RBC: 5.17 MIL/uL — ABNORMAL HIGH (ref 3.87–5.11)
RDW: 12.5 % (ref 11.5–15.5)
WBC: 9.8 K/uL (ref 4.0–10.5)
nRBC: 0 % (ref 0.0–0.2)

## 2024-01-09 LAB — BASIC METABOLIC PANEL WITH GFR
Anion gap: 10 (ref 5–15)
BUN: 13 mg/dL (ref 8–23)
CO2: 25 mmol/L (ref 22–32)
Calcium: 10 mg/dL (ref 8.9–10.3)
Chloride: 104 mmol/L (ref 98–111)
Creatinine, Ser: 0.61 mg/dL (ref 0.44–1.00)
GFR, Estimated: >60 mL/min
Glucose, Bld: 92 mg/dL (ref 70–99)
Potassium: 4.1 mmol/L (ref 3.5–5.1)
Sodium: 140 mmol/L (ref 135–145)

## 2024-01-09 LAB — TYPE AND SCREEN
ABO/RH(D): A POS
Antibody Screen: NEGATIVE

## 2024-01-09 LAB — SURGICAL PCR SCREEN
MRSA, PCR: NEGATIVE
Staphylococcus aureus: POSITIVE — AB

## 2024-01-09 NOTE — Patient Instructions (Signed)
 Your procedure is scheduled on: Jan 13/2026 Report to the Registration Desk on the 1st floor of the [facility] To find out your arrival time, please call [phone] between 1PM - 3PM on: Jan 12/2024  If your arrival time is 6:00 am, do not arrive before that time as the Medical Mall entrance doors do not open until 6:00 am.  REMEMBER: Instructions that are not followed completely may result in serious medical risk, up to and including death; or upon the discretion of your surgeon and anesthesiologist your surgery may need to be rescheduled.  Do not eat food after midnight the night before surgery.  No gum chewing or hard candies.    One week prior to surgery: Stop Anti-inflammatories (NSAIDS) such as Advil, Aleve, Ibuprofen, Motrin, Naproxen, Naprosyn and Aspirin based products such as Excedrin, Goody's Powder, BC Powder. Stop ANY OVER THE COUNTER supplements until after surgery.  You may however, continue to take Tylenol if needed for pain up until the day of surgery.   Continue taking all of your other prescription medications up until the day of surgery.  ON THE DAY OF SURGERY ONLY TAKE THESE MEDICATIONS WITH SIPS OF WATER:  amLODipine (NORVASC) -if needed gabapentin (NEURONTIN) methocarbamol (ROBAXIN)    Use inhalers on the day of surgery and bring to the hospital.   No Alcohol for 24 hours before or after surgery.  No Smoking including e-cigarettes for 24 hours before surgery.  No chewable tobacco products for at least 6 hours before surgery.  No nicotine patches on the day of surgery.  Do not use any recreational drugs for at least a week (preferably 2 weeks) before your surgery.  Please be advised that the combination of cocaine and anesthesia may have negative outcomes, up to and including death. If you test positive for cocaine, your surgery will be cancelled.  On the morning of surgery brush your teeth with toothpaste and water, you may rinse your mouth  with mouthwash if you wish. Do not swallow any toothpaste or mouthwash.  Use CHG Soap or wipes as directed on instruction sheet.-  Do not wear jewelry, make-up, hairpins, clips or nail polish.  For welded (permanent) jewelry: bracelets, anklets, waist bands, etc.  Please have this removed prior to surgery.  If it is not removed, there is a chance that hospital personnel will need to cut it off on the day of surgery.  Do not wear lotions, powders, or perfumes.   Do not shave body hair from the neck down 48 hours before surgery.  Contact lenses, hearing aids and dentures may not be worn into surgery.  Do not bring valuables to the hospital. [facility] is not responsible for any missing/lost belongings or valuables.    Bring your C-PAP to the hospital in case you may have to spend the night.   Notify your doctor if there is any change in your medical condition (cold, fever, infection).  Wear comfortable clothing (specific to your surgery type) to the hospital.  After surgery, you can help prevent lung complications by doing breathing exercises.  Take deep breaths and cough every 1-2 hours. Your doctor may order a device called an Incentive Spirometer to help you take deep breaths. If you are being admitted to the hospital overnight, leave your suitcase in the car. After surgery it may be brought to your room.  In case of increased patient census, it may be necessary for you, the patient, to continue your postoperative care in the Same  Day Surgery department.  If you are being discharged the day of surgery, you will not be allowed to drive home. You will need a responsible individual to drive you home and stay with you for 24 hours after surgery.    Please call the Pre-admissions Testing Dept. at [phone] if you have any questions about these instructions.  Surgery Visitation Policy:  Patients having surgery or a procedure may have two visitors.  Children under the age of 17  must have an adult with them who is not the patient.  Inpatient Visitation:    Visiting hours are 7 a.m. to 8 p.m. Up to four visitors are allowed at one time in a patient room. The visitors may rotate out with other people during the day.  One visitor age 34 or older may stay with the patient overnight and must be in the room by 8 p.m.   Merchandiser, Retail to address health-related social needs:  [email or web address]     Pre-operative 4 CHG Bath Instructions   You can play a key role in reducing the risk of infection after surgery. Your skin needs to be as free of germs as possible. You can reduce the number of germs on your skin by washing with CHG (chlorhexidine gluconate) soap before surgery. CHG is an antiseptic soap that kills germs and continues to kill germs even after washing.   DO NOT use if you have an allergy to chlorhexidine/CHG or antibacterial soaps. If your skin becomes reddened or irritated, stop using the CHG and notify one of our RNs at [phone].   Please shower with the CHG soap starting 4 days before surgery using the following schedule:        Please keep in mind the following:  DO NOT shave, including legs and underarms, starting the day of your first shower.   You may shave your face at any point before/day of surgery.  Place clean sheets on your bed the day you start using CHG soap. Use a clean washcloth (not used since being washed) for each shower. DO NOT sleep with pets once you start using the CHG.   CHG Shower Instructions:  If you choose to wash your hair and private area, wash first with your normal shampoo/soap.  After you use shampoo/soap, rinse your hair and body thoroughly to remove shampoo/soap residue.  Turn the water OFF and apply about 3 tablespoons (45 ml) of CHG soap to a CLEAN washcloth.  Apply CHG soap ONLY FROM YOUR NECK DOWN TO YOUR TOES (washing for 3-5 minutes)  DO NOT use CHG soap on face, private areas,  open wounds, or sores.  Pay special attention to the area where your surgery is being performed.  If you are having back surgery, having someone wash your back for you may be helpful. Wait 2 minutes after CHG soap is applied, then you may rinse off the CHG soap.  Pat dry with a clean towel  Put on clean clothes/pajamas   If you choose to wear lotion, please use ONLY the CHG-compatible lotions on the back of this paper.     Additional instructions for the day of surgery: DO NOT APPLY any lotions, deodorants, cologne, or perfumes.   Put on clean/comfortable clothes.  Brush your teeth.  Ask your nurse before applying any prescription medications to the skin.      CHG Compatible Lotions   Aveeno Moisturizing lotion  Cetaphil Moisturizing Cream  Cetaphil Moisturizing Lotion  Clairol Herbal Essence Moisturizing Lotion,  Dry Skin  Clairol Herbal Essence Moisturizing Lotion, Extra Dry Skin  Clairol Herbal Essence Moisturizing Lotion, Normal Skin  Curel Age Defying Therapeutic Moisturizing Lotion with Alpha Hydroxy  Curel Extreme Care Body Lotion  Curel Soothing Hands Moisturizing Hand Lotion  Curel Therapeutic Moisturizing Cream, Fragrance-Free  Curel Therapeutic Moisturizing Lotion, Fragrance-Free  Curel Therapeutic Moisturizing Lotion, Original Formula  Eucerin Daily Replenishing Lotion  Eucerin Dry Skin Therapy Plus Alpha Hydroxy Crme  Eucerin Dry Skin Therapy Plus Alpha Hydroxy Lotion  Eucerin Original Crme  Eucerin Original Lotion  Eucerin Plus Crme Eucerin Plus Lotion  Eucerin TriLipid Replenishing Lotion  Keri Anti-Bacterial Hand Lotion  Keri Deep Conditioning Original Lotion Dry Skin Formula Softly Scented  Keri Deep Conditioning Original Lotion, Fragrance Free Sensitive Skin Formula  Keri Lotion Fast Absorbing Fragrance Free Sensitive Skin Formula  Keri Lotion Fast Absorbing Softly Scented Dry Skin Formula  Keri Original Lotion  Keri Skin Renewal Lotion Keri Silky  Smooth Lotion  Keri Silky Smooth Sensitive Skin Lotion  Nivea Body Creamy Conditioning Oil  Nivea Body Extra Enriched Teacher, Adult Education Moisturizing Lotion Nivea Crme  Nivea Skin Firming Lotion  NutraDerm 30 Skin Lotion  NutraDerm Skin Lotion  NutraDerm Therapeutic Skin Cream  NutraDerm Therapeutic Skin Lotion  ProShield Protective Hand Cream  Provon moisturizing lotion

## 2024-01-16 ENCOUNTER — Ambulatory Visit
Admission: RE | Admit: 2024-01-16 | Discharge: 2024-01-16 | Disposition: A | Discharge status: Home / Self Care | Payer: Medicare (Managed Care) | Source: Ambulatory Visit | Attending: Internal Medicine | Admitting: Internal Medicine

## 2024-01-16 DIAGNOSIS — I10 Essential (primary) hypertension: Secondary | ICD-10-CM | POA: Insufficient documentation

## 2024-01-16 DIAGNOSIS — R1314 Dysphagia, pharyngoesophageal phase: Secondary | ICD-10-CM | POA: Insufficient documentation

## 2024-01-16 NOTE — Therapy (Signed)
 Modified Barium Swallow Study  Patient Details  Name: Taylor Marshall MRN: [id number] Date of Birth: 03-May-1950  Today's Date: 01/16/2024  Modified Barium Swallow completed.  Full report located under Chart Review in the Imaging Section.  History of Present Illness Pt is a 74 y.o. female who presents for MBSS. Per Dr. Willodean note, She reports a new issue with swallowing, particularly when lying on her back. She describes a sensation of food getting caught and an inability to breathe, which is alleviated by turning onto her side. This issue is more pronounced with solid foods like sandwiches or bread. MRI c-spine 1. 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 Severe foraminal stenoses at these levels. 2. Moderate foraminal stenoses bilaterally at C5-C6 and on the right at C6-C7. Pt scheduled for C3-C5 posterior cervical foraminotomies tomorrow. PMHx including, but not limited to, GERD, hiatel hernia, and COPD.   Clinical Impression Pt seen for MBSS. Pt demonstrated intact oropharyngeal swallow function with age-related swallowing changes including oral stasis and swallow initiation at the level of the pyriform sinuses. Piecemeal swallow noted as well. Concern for pharyngoesophageal dysphagia given observed retention of barium in the thoracic esophagus with retrograde flow below the PES as well as pt's complaints. Recommend continuation of current diet with standard aspiration precautions and reflux precautions. Consider further GI work up/esophageal assessment. Factors that may increase risk of adverse event in presence of aspiration Taylor Marshall 2021): Respiratory or GI disease  Swallow Evaluation Recommendations Recommendations: PO diet PO Diet Recommendation: Regular;Thin liquids (Level 0) Liquid Administration via: Spoon;Cup;Straw Medication Administration: Whole meds with liquid (as tolerated) Supervision: Patient able to  self-feed Swallowing strategies  : Follow solids with liquids Postural changes: Position pt fully upright for meals;Out of bed for meals (upright at least 60 minutes after meals; reflux precuations) Oral care recommendations: Oral care BID (2x/day) Recommended consults: Consider GI consultation;Consider esophageal assessment      Taylor Marshall 01/16/2024,1:34 PM

## 2024-01-16 NOTE — Anesthesia Preprocedure Evaluation (Signed)
"                                    Anesthesia Evaluation  Patient identified by MRN, date of birth, ID band Patient awake    Reviewed: Allergy & Precautions, H&P , NPO status , Patient's Chart, lab work & pertinent test results  Airway Mallampati: II  TM Distance: >3 FB Neck ROM: full    Dental no notable dental hx.    Pulmonary COPD, former smoker   Pulmonary exam normal        Cardiovascular Normal cardiovascular exam  CTA 2025:  IMPRESSION: 1. 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 No ascending aortic aneurysm, intramural hematoma, or aortic dissection. Follow-up can be considered as documented below. 2. Similar scarring or atelectasis along the medial left lung apex. Otherwise, no pneumonia, pulmonary edema, or pleural effusion.    Neuro/Psych  Neuromuscular disease (cervical radiculopathy)  negative psych ROS   GI/Hepatic Neg liver ROS, hiatal hernia,GERD  ,,Pharyngoesophageal Dysphagia   Endo/Other  negative endocrine ROS    Renal/GU      Musculoskeletal  (+) Arthritis ,    Abdominal  (+) + obese  Peds  Hematology negative hematology ROS (+)   Anesthesia Other Findings Past Medical History: No date: Arthritis No date: GERD (gastroesophageal reflux disease) No date: Neuromuscular disorder (HCC)  Past Surgical History: No date: BREAST EXCISIONAL BIOPSY; Right 2006?: COLONOSCOPY     Comment:  Dr Chipper No date: VAGINAL HYSTERECTOMY     Reproductive/Obstetrics negative OB ROS                              Anesthesia Physical Anesthesia Plan  ASA: 3  Anesthesia Plan: General ETT   Post-op Pain Management:    Induction:   PONV Risk Score and Plan: 2 and Ondansetron  and Dexamethasone   Airway Management Planned:   Additional Equipment:   Intra-op Plan:   Post-operative Plan: Extubation in OR  Informed Consent:      Dental  Advisory Given  Plan Discussed with: CRNA and Surgeon  Anesthesia Plan Comments:          Anesthesia Quick Evaluation  "

## 2024-01-17 ENCOUNTER — Encounter: Admission: RE | Disposition: A | Payer: Self-pay | Source: Home / Self Care | Attending: Neurosurgery

## 2024-01-17 ENCOUNTER — Encounter: Payer: Self-pay | Admitting: Anesthesiology

## 2024-01-17 ENCOUNTER — Other Ambulatory Visit: Payer: Self-pay

## 2024-01-17 ENCOUNTER — Ambulatory Visit: Payer: Medicare (Managed Care)

## 2024-01-17 ENCOUNTER — Ambulatory Visit: Payer: Self-pay | Admitting: Anesthesiology

## 2024-01-17 ENCOUNTER — Encounter: Payer: Self-pay | Admitting: Neurosurgery

## 2024-01-17 ENCOUNTER — Observation Stay
Admission: RE | Admit: 2024-01-17 | Discharge: 2024-01-18 | Disposition: A | Discharge status: Home / Self Care | Payer: Medicare (Managed Care) | Attending: Neurosurgery | Admitting: Neurosurgery

## 2024-01-17 DIAGNOSIS — M4722 Other spondylosis with radiculopathy, cervical region: Secondary | ICD-10-CM | POA: Insufficient documentation

## 2024-01-17 DIAGNOSIS — M5412 Radiculopathy, cervical region: Secondary | ICD-10-CM | POA: Diagnosis not present

## 2024-01-17 DIAGNOSIS — M542 Cervicalgia: Secondary | ICD-10-CM | POA: Diagnosis present

## 2024-01-17 DIAGNOSIS — Z87891 Personal history of nicotine dependence: Secondary | ICD-10-CM | POA: Diagnosis not present

## 2024-01-17 DIAGNOSIS — M4802 Spinal stenosis, cervical region: Principal | ICD-10-CM | POA: Insufficient documentation

## 2024-01-17 DIAGNOSIS — Z01818 Encounter for other preprocedural examination: Secondary | ICD-10-CM

## 2024-01-17 DIAGNOSIS — Z01812 Encounter for preprocedural laboratory examination: Secondary | ICD-10-CM

## 2024-01-17 HISTORY — PX: CLOSURE, FISTULA, GASTROCUTANEOUS: SHX7233

## 2024-01-17 LAB — ABO/RH: ABO/RH(D): A POS

## 2024-01-17 MED ORDER — FENTANYL CITRATE (PF) 100 MCG/2ML IJ SOLN
INTRAMUSCULAR | Status: DC | PRN
  Administered 2024-01-17: 50 ug via INTRAVENOUS

## 2024-01-17 MED ORDER — SENNA 8.6 MG PO TABS
1.0000 | ORAL_TABLET | Freq: Two times a day (BID) | ORAL | Status: DC
  Administered 2024-01-17 – 2024-01-18 (×2): 8.6 mg via ORAL
  Filled 2024-01-17 (×2): qty 1

## 2024-01-17 MED ORDER — OXYCODONE HCL 5 MG PO TABS: 5.0000 mg | ORAL_TABLET | ORAL | Status: DC | PRN

## 2024-01-17 MED ORDER — METHOCARBAMOL 1000 MG/10ML IJ SOLN: 500.0000 mg | Freq: Four times a day (QID) | INTRAMUSCULAR | Status: DC | PRN

## 2024-01-17 MED ORDER — POLYETHYLENE GLYCOL 3350 17 G PO PACK: 17.0000 g | PACK | Freq: Every day | ORAL | Status: DC | PRN

## 2024-01-17 MED ORDER — GABAPENTIN 300 MG PO CAPS: 300.0000 mg | ORAL_CAPSULE | Freq: Every day | ORAL | Status: DC

## 2024-01-17 MED ORDER — MIDAZOLAM HCL 2 MG/2ML IJ SOLN
INTRAMUSCULAR | Status: AC
  Filled 2024-01-17: qty 2

## 2024-01-17 MED ORDER — ACETAMINOPHEN 10 MG/ML IV SOLN
INTRAVENOUS | Status: DC | PRN
  Administered 2024-01-17: 1000 mg via INTRAVENOUS

## 2024-01-17 MED ORDER — DEXAMETHASONE SOD PHOSPHATE PF 10 MG/ML IJ SOLN
INTRAMUSCULAR | Status: DC | PRN
  Administered 2024-01-17: 10 mg via INTRAVENOUS

## 2024-01-17 MED ORDER — SENNA 8.6 MG PO TABS
1.0000 | ORAL_TABLET | Freq: Two times a day (BID) | ORAL | 0 refills | Status: AC | PRN
  Filled 2024-01-17: qty 30, 15d supply, fill #0

## 2024-01-17 MED ORDER — ACETAMINOPHEN 10 MG/ML IV SOLN: 1000.0000 mg | Freq: Once | INTRAVENOUS | Status: DC | PRN

## 2024-01-17 MED ORDER — PROPOFOL 10 MG/ML IV BOLUS
INTRAVENOUS | Status: AC
  Filled 2024-01-17: qty 20

## 2024-01-17 MED ORDER — SODIUM CHLORIDE 0.9% FLUSH
3.0000 mL | Freq: Two times a day (BID) | INTRAVENOUS | Status: DC
  Administered 2024-01-17 – 2024-01-18 (×2): 3 mL via INTRAVENOUS

## 2024-01-17 MED ORDER — GABAPENTIN 300 MG PO CAPS
300.0000 mg | ORAL_CAPSULE | Freq: Every day | ORAL | Status: DC
  Administered 2024-01-17: 300 mg via ORAL
  Filled 2024-01-17: qty 1

## 2024-01-17 MED ORDER — ACETAMINOPHEN 10 MG/ML IV SOLN
INTRAVENOUS | Status: AC
  Filled 2024-01-17: qty 100

## 2024-01-17 MED ORDER — ONDANSETRON HCL 4 MG/2ML IJ SOLN
4.0000 mg | Freq: Four times a day (QID) | INTRAMUSCULAR | Status: DC | PRN
  Filled 2024-01-17: qty 2

## 2024-01-17 MED ORDER — BUPIVACAINE-EPINEPHRINE (PF) 0.5% -1:200000 IJ SOLN
INTRAMUSCULAR | Status: DC | PRN
  Administered 2024-01-17: 10 mL

## 2024-01-17 MED ORDER — ORAL CARE MOUTH RINSE: 15.0000 mL | Freq: Once | OROMUCOSAL | Status: AC

## 2024-01-17 MED ORDER — KETOROLAC TROMETHAMINE 30 MG/ML IJ SOLN
INTRAMUSCULAR | Status: AC
  Filled 2024-01-17: qty 1

## 2024-01-17 MED ORDER — PHENOL 1.4 % MT LIQD: 1.0000 | OROMUCOSAL | Status: DC | PRN

## 2024-01-17 MED ORDER — FENTANYL CITRATE (PF) 100 MCG/2ML IJ SOLN
25.0000 ug | INTRAMUSCULAR | Status: DC | PRN
  Administered 2024-01-17: 25 ug via INTRAVENOUS
  Administered 2024-01-17 (×2): 50 ug via INTRAVENOUS

## 2024-01-17 MED ORDER — BUPIVACAINE HCL (PF) 0.5 % IJ SOLN
INTRAMUSCULAR | Status: DC | PRN
  Administered 2024-01-17: 30 mL

## 2024-01-17 MED ORDER — ONDANSETRON HCL 4 MG/2ML IJ SOLN
4.0000 mg | Freq: Once | INTRAMUSCULAR | Status: AC
  Administered 2024-01-17: 4 mg via INTRAVENOUS

## 2024-01-17 MED ORDER — FENTANYL CITRATE (PF) 100 MCG/2ML IJ SOLN
INTRAMUSCULAR | Status: AC
  Filled 2024-01-17: qty 2

## 2024-01-17 MED ORDER — PROPOFOL 10 MG/ML IV BOLUS
INTRAVENOUS | Status: DC | PRN
  Administered 2024-01-17: 140 mg via INTRAVENOUS

## 2024-01-17 MED ORDER — LIDOCAINE HCL (CARDIAC) PF 100 MG/5ML IV SOSY
PREFILLED_SYRINGE | INTRAVENOUS | Status: DC | PRN
  Administered 2024-01-17 (×2): 50 mg via INTRAVENOUS

## 2024-01-17 MED ORDER — CHLORHEXIDINE GLUCONATE 0.12 % MT SOLN
OROMUCOSAL | Status: AC
  Filled 2024-01-17: qty 15

## 2024-01-17 MED ORDER — ONDANSETRON HCL 4 MG/2ML IJ SOLN
INTRAMUSCULAR | Status: AC
  Filled 2024-01-17: qty 2

## 2024-01-17 MED ORDER — ACETAMINOPHEN 500 MG PO TABS
1000.0000 mg | ORAL_TABLET | Freq: Four times a day (QID) | ORAL | Status: DC
  Administered 2024-01-18 (×2): 1000 mg via ORAL
  Filled 2024-01-17 (×3): qty 2

## 2024-01-17 MED ORDER — BUPIVACAINE HCL (PF) 0.5 % IJ SOLN
INTRAMUSCULAR | Status: AC
  Filled 2024-01-17: qty 30

## 2024-01-17 MED ORDER — DEXAMETHASONE SOD PHOSPHATE PF 10 MG/ML IJ SOLN
INTRAMUSCULAR | Status: AC
  Filled 2024-01-17: qty 1

## 2024-01-17 MED ORDER — MIDAZOLAM HCL (PF) 2 MG/2ML IJ SOLN
INTRAMUSCULAR | Status: DC | PRN
  Administered 2024-01-17: 1 mg via INTRAVENOUS

## 2024-01-17 MED ORDER — 0.9 % SODIUM CHLORIDE (POUR BTL) OPTIME
TOPICAL | Status: DC | PRN
  Administered 2024-01-17: 500 mL

## 2024-01-17 MED ORDER — PHENYLEPHRINE 80 MCG/ML (10ML) SYRINGE FOR IV PUSH (FOR BLOOD PRESSURE SUPPORT)
PREFILLED_SYRINGE | INTRAVENOUS | Status: DC | PRN
  Administered 2024-01-17 (×2): 80 ug via INTRAVENOUS
  Administered 2024-01-17: 160 ug via INTRAVENOUS

## 2024-01-17 MED ORDER — PHENYLEPHRINE 80 MCG/ML (10ML) SYRINGE FOR IV PUSH (FOR BLOOD PRESSURE SUPPORT)
PREFILLED_SYRINGE | INTRAVENOUS | Status: AC
  Filled 2024-01-17: qty 10

## 2024-01-17 MED ORDER — CHLORHEXIDINE GLUCONATE 4 % EX SOLN
1.0000 | CUTANEOUS | 1 refills | Status: AC
  Filled 2024-01-17: qty 236, 30d supply, fill #0

## 2024-01-17 MED ORDER — ONDANSETRON HCL 4 MG/2ML IJ SOLN
INTRAMUSCULAR | Status: DC | PRN
  Administered 2024-01-17: 4 mg via INTRAVENOUS

## 2024-01-17 MED ORDER — EPHEDRINE 5 MG/ML INJ
INTRAVENOUS | Status: AC
  Filled 2024-01-17: qty 5

## 2024-01-17 MED ORDER — CEFAZOLIN SODIUM-DEXTROSE 2-4 GM/100ML-% IV SOLN
INTRAVENOUS | Status: AC
  Filled 2024-01-17: qty 100

## 2024-01-17 MED ORDER — ROCURONIUM BROMIDE 100 MG/10ML IV SOLN
INTRAVENOUS | Status: DC | PRN
  Administered 2024-01-17 (×3): 10 mg via INTRAVENOUS
  Administered 2024-01-17: 50 mg via INTRAVENOUS

## 2024-01-17 MED ORDER — LACTATED RINGERS IV SOLN: INTRAVENOUS | Status: DC

## 2024-01-17 MED ORDER — PANTOPRAZOLE SODIUM 40 MG PO TBEC
40.0000 mg | DELAYED_RELEASE_TABLET | Freq: Every day | ORAL | Status: DC
  Administered 2024-01-18: 40 mg via ORAL
  Filled 2024-01-17: qty 1

## 2024-01-17 MED ORDER — OXYCODONE HCL 5 MG/5ML PO SOLN: 5.0000 mg | Freq: Once | ORAL | Status: DC | PRN

## 2024-01-17 MED ORDER — KETOROLAC TROMETHAMINE 30 MG/ML IJ SOLN
INTRAMUSCULAR | Status: DC | PRN
  Administered 2024-01-17: 30 mg via INTRAVENOUS

## 2024-01-17 MED ORDER — EPHEDRINE SULFATE-NACL 50-0.9 MG/10ML-% IV SOSY
PREFILLED_SYRINGE | INTRAVENOUS | Status: DC | PRN
  Administered 2024-01-17: 5 mg via INTRAVENOUS
  Administered 2024-01-17: 10 mg via INTRAVENOUS

## 2024-01-17 MED ORDER — BUPIVACAINE-EPINEPHRINE (PF) 0.5% -1:200000 IJ SOLN
INTRAMUSCULAR | Status: AC
  Filled 2024-01-17: qty 30

## 2024-01-17 MED ORDER — KETOROLAC TROMETHAMINE 15 MG/ML IJ SOLN
7.5000 mg | Freq: Four times a day (QID) | INTRAMUSCULAR | Status: DC
  Administered 2024-01-17 – 2024-01-18 (×3): 7.5 mg via INTRAVENOUS
  Filled 2024-01-17 (×3): qty 1

## 2024-01-17 MED ORDER — MAGNESIUM CITRATE PO SOLN: 1.0000 | Freq: Once | ORAL | Status: DC | PRN

## 2024-01-17 MED ORDER — PHENYLEPHRINE HCL-NACL 20-0.9 MG/250ML-% IV SOLN
INTRAVENOUS | Status: DC | PRN
  Administered 2024-01-17: 40 ug/min via INTRAVENOUS

## 2024-01-17 MED ORDER — SODIUM CHLORIDE 0.9% FLUSH: 3.0000 mL | INTRAVENOUS | Status: DC | PRN

## 2024-01-17 MED ORDER — MUPIROCIN 2 % EX OINT
1.0000 | TOPICAL_OINTMENT | Freq: Two times a day (BID) | CUTANEOUS | 0 refills | Status: AC
  Filled 2024-01-17: qty 22, 30d supply, fill #0

## 2024-01-17 MED ORDER — SODIUM CHLORIDE 0.9 % IV SOLN
250.0000 mL | INTRAVENOUS | Status: DC
  Administered 2024-01-17: 250 mL via INTRAVENOUS

## 2024-01-17 MED ORDER — DROPERIDOL 2.5 MG/ML IJ SOLN
0.6250 mg | Freq: Once | INTRAMUSCULAR | Status: AC | PRN
  Administered 2024-01-17: 0.625 mg via INTRAVENOUS

## 2024-01-17 MED ORDER — DOCUSATE SODIUM 100 MG PO CAPS
100.0000 mg | ORAL_CAPSULE | Freq: Two times a day (BID) | ORAL | Status: DC
  Administered 2024-01-17 – 2024-01-18 (×2): 100 mg via ORAL
  Filled 2024-01-17 (×2): qty 1

## 2024-01-17 MED ORDER — SORBITOL 70 % SOLN: 30.0000 mL | Freq: Every day | Status: DC | PRN

## 2024-01-17 MED ORDER — METHYLPREDNISOLONE ACETATE 40 MG/ML IJ SUSP
INTRAMUSCULAR | Status: AC
  Filled 2024-01-17: qty 1

## 2024-01-17 MED ORDER — OXYCODONE HCL 5 MG PO TABS: 10.0000 mg | ORAL_TABLET | ORAL | Status: DC | PRN

## 2024-01-17 MED ORDER — GABAPENTIN 300 MG PO CAPS
600.0000 mg | ORAL_CAPSULE | Freq: Every morning | ORAL | Status: DC
  Administered 2024-01-18: 600 mg via ORAL
  Filled 2024-01-17: qty 2

## 2024-01-17 MED ORDER — METHOCARBAMOL 500 MG PO TABS: 500.0000 mg | ORAL_TABLET | Freq: Four times a day (QID) | ORAL | Status: DC | PRN

## 2024-01-17 MED ORDER — LIDOCAINE HCL (PF) 2 % IJ SOLN
INTRAMUSCULAR | Status: AC
  Filled 2024-01-17: qty 5

## 2024-01-17 MED ORDER — SEVOFLURANE IN SOLN
RESPIRATORY_TRACT | Status: AC
  Filled 2024-01-17: qty 250

## 2024-01-17 MED ORDER — OXYCODONE HCL 5 MG PO TABS: 5.0000 mg | ORAL_TABLET | Freq: Once | ORAL | Status: DC | PRN

## 2024-01-17 MED ORDER — SUGAMMADEX SODIUM 200 MG/2ML IV SOLN
INTRAVENOUS | Status: DC | PRN
  Administered 2024-01-17 (×2): 200 mg via INTRAVENOUS

## 2024-01-17 MED ORDER — METHOCARBAMOL 500 MG PO TABS
500.0000 mg | ORAL_TABLET | Freq: Four times a day (QID) | ORAL | 0 refills | Status: AC | PRN
  Filled 2024-01-17: qty 120, 30d supply, fill #0

## 2024-01-17 MED ORDER — ACETAMINOPHEN 650 MG RE SUPP: 650.0000 mg | RECTAL | Status: DC | PRN

## 2024-01-17 MED ORDER — ROCURONIUM BROMIDE 10 MG/ML (PF) SYRINGE
PREFILLED_SYRINGE | INTRAVENOUS | Status: AC
  Filled 2024-01-17: qty 10

## 2024-01-17 MED ORDER — ONDANSETRON HCL 4 MG PO TABS: 4.0000 mg | ORAL_TABLET | Freq: Four times a day (QID) | ORAL | Status: DC | PRN

## 2024-01-17 MED ORDER — CHLORHEXIDINE GLUCONATE 0.12 % MT SOLN
15.0000 mL | Freq: Once | OROMUCOSAL | Status: AC
  Administered 2024-01-17: 15 mL via OROMUCOSAL

## 2024-01-17 MED ORDER — ENOXAPARIN SODIUM 40 MG/0.4ML IJ SOSY
40.0000 mg | PREFILLED_SYRINGE | INTRAMUSCULAR | Status: DC
  Administered 2024-01-18: 40 mg via SUBCUTANEOUS
  Filled 2024-01-17: qty 0.4

## 2024-01-17 MED ORDER — OXYCODONE HCL 5 MG PO TABS
5.0000 mg | ORAL_TABLET | ORAL | 0 refills | Status: AC | PRN
  Filled 2024-01-17: qty 30, 5d supply, fill #0

## 2024-01-17 MED ORDER — MENTHOL 3 MG MT LOZG: 1.0000 | LOZENGE | OROMUCOSAL | Status: DC | PRN

## 2024-01-17 MED ORDER — PHENYLEPHRINE HCL-NACL 20-0.9 MG/250ML-% IV SOLN
INTRAVENOUS | Status: AC
  Filled 2024-01-17: qty 250

## 2024-01-17 MED ORDER — CEFAZOLIN SODIUM-DEXTROSE 2-4 GM/100ML-% IV SOLN
2.0000 g | Freq: Once | INTRAVENOUS | Status: AC
  Administered 2024-01-17: 2 g via INTRAVENOUS

## 2024-01-17 MED ORDER — DROPERIDOL 2.5 MG/ML IJ SOLN
INTRAMUSCULAR | Status: AC
  Filled 2024-01-17: qty 2

## 2024-01-17 MED ORDER — ALBUTEROL SULFATE HFA 108 (90 BASE) MCG/ACT IN AERS: 2.0000 | INHALATION_SPRAY | Freq: Four times a day (QID) | RESPIRATORY_TRACT | Status: DC | PRN

## 2024-01-17 MED ORDER — HYDROMORPHONE HCL 1 MG/ML IJ SOLN: 0.5000 mg | INTRAMUSCULAR | Status: AC | PRN

## 2024-01-17 MED ORDER — SURGIFLO WITH THROMBIN (HEMOSTATIC MATRIX KIT) OPTIME
TOPICAL | Status: DC | PRN
  Administered 2024-01-17: 1 via TOPICAL

## 2024-01-17 MED ORDER — ACETAMINOPHEN 325 MG PO TABS: 650.0000 mg | ORAL_TABLET | ORAL | Status: DC | PRN

## 2024-01-17 NOTE — Plan of Care (Signed)
" °  Problem: Education: Goal: Knowledge of the prescribed therapeutic regimen will improve Outcome: Progressing   Problem: Bowel/Gastric: Goal: Gastrointestinal status for postoperative course will improve Outcome: Progressing   Problem: Cardiac: Goal: Ability to maintain an adequate cardiac output Outcome: Progressing Goal: Will show no evidence of cardiac arrhythmias Outcome: Progressing   Problem: Nutritional: Goal: Will attain and maintain optimal nutritional status Outcome: Progressing   Problem: Clinical Measurements: Goal: Ability to maintain clinical measurements within normal limits Outcome: Progressing Goal: Postoperative complications will be avoided or minimized Outcome: Progressing   Problem: Neurological: Goal: Will regain or maintain usual level of consciousness Outcome: Progressing   Problem: Respiratory: Goal: Will regain and/or maintain adequate ventilation Outcome: Progressing Goal: Respiratory status will improve Outcome: Progressing   Problem: Skin Integrity: Goal: Demonstrates signs of wound healing without infection Outcome: Progressing   Problem: Urinary Elimination: Goal: Will remain free from infection Outcome: Progressing Goal: Ability to achieve and maintain adequate urine output Outcome: Progressing   Problem: Education: Goal: Knowledge of General Education information will improve Description: Including pain rating scale, medication(s)/side effects and non-pharmacologic comfort measures Outcome: Progressing   Problem: Nutrition: Goal: Adequate nutrition will be maintained Outcome: Progressing   Problem: Activity: Goal: Risk for activity intolerance will decrease Outcome: Progressing   Problem: Coping: Goal: Level of anxiety will decrease Outcome: Progressing   "

## 2024-01-17 NOTE — Anesthesia Procedure Notes (Signed)
 Procedure Name: Intubation Date/Time: 01/17/2024 7:25 AM  Performed by: Trudy Rankin LABOR, CRNAPre-anesthesia Checklist: Patient identified, Patient being monitored, Timeout performed, Emergency Drugs available and Suction available Patient Re-evaluated:Patient Re-evaluated prior to induction Oxygen Delivery Method: Circle System Utilized Preoxygenation: Pre-oxygenation with 100% oxygen Induction Type: IV induction and Rapid sequence Laryngoscope Size: Mac, 3 and McGrath Grade View: Grade I Tube type: Oral Tube size: 7.5 mm Number of attempts: 1 Airway Equipment and Method: Stylet and Video-laryngoscopy Placement Confirmation: ETT inserted through vocal cords under direct vision, positive ETCO2 and breath sounds checked- equal and bilateral Secured at: 20 cm Tube secured with: Tape Dental Injury: Teeth and Oropharynx as per pre-operative assessment

## 2024-01-17 NOTE — Plan of Care (Signed)
" °  Problem: Education: Goal: Knowledge of the prescribed therapeutic regimen will improve Outcome: Progressing   Problem: Nutrition: Goal: Adequate nutrition will be maintained Outcome: Progressing   Problem: Coping: Goal: Level of anxiety will decrease Outcome: Progressing   Problem: Pain Managment: Goal: General experience of comfort will improve and/or be controlled Outcome: Progressing   "

## 2024-01-17 NOTE — Discharge Instructions (Addendum)
 " Your surgeon has performed an operation on your cervical spine (neck) to relieve pressure on the spinal cord and/or nerves.   The following are instructions to help in your recovery once you have been discharged from the hospital. Even if you feel well, it is important that you follow these activity guidelines. If you do not let your neck heal properly from the surgery, you can increase the chance of return of your symptoms and other complications.  * It is ok to take anti-inflammatory medications  after surgery (naproxen [Aleve], ibuprofen [Advil, Motrin] if needed.  **Regarding compression stockings-  Please wear day and night until you are walking a couple hundred feet three times a day.   Activity    No bending, lifting, or twisting (BLT). Avoid lifting objects heavier than 10 pounds (gallon milk jug).  Where possible, avoid household activities that involve lifting, bending, reaching, pushing, or pulling such as laundry, vacuuming, grocery shopping, and childcare. Try to arrange for help from friends and family for these activities while your back heals.  Increase physical activity slowly as tolerated.  Taking short walks is encouraged, but avoid strenuous exercise. Do not jog, run, bicycle, lift weights, or participate in any other exercises unless specifically allowed by your doctor.  Talk to your doctor before resuming sexual activity.  You should not drive until cleared by your doctor.  Until released by your doctor, you should not return to work or school.  You should rest at home and let your body heal.   You may shower three days after your surgery.  After showering, lightly dab your incision dry. Do not take a tub bath or go swimming until approved by your doctor at your follow-up appointment.  If your doctor ordered a cervical collar (neck brace) for you, you should wear it whenever you are out of bed. You may remove it when lying down or sleeping, but you should wear it at all  other times. Not all neck surgeries require a cervical collar.  If you smoke, we strongly recommend that you quit.  Smoking has been proven to interfere with normal bone healing and will dramatically reduce the success rate of your surgery. Please contact [facility] (800-QUIT-NOW) and use the resources at www.[facility].com for assistance in stopping smoking.  Surgical Incision   If you have a dressing on your incision, you may remove it two days after your surgery. Keep your incision area clean and dry.  If you do not have staples or stitches, you will have steri-strips (small pieces of surgical tape) or Dermabond glue. The steri-strips/glue should begin to peel away within about a week (it is fine if the steri-strips fall off before then). If the strips are still in place one week after your surgery, you may gently remove them.  Diet           You may return to your usual diet. However, you may experience discomfort when swallowing in the first month after your surgery. This is normal. You may find that softer foods are more comfortable for you to swallow. Be sure to stay hydrated.  When to Contact Us   You may experience pain in your neck and/or pain between your shoulder blades. This is normal and should improve in the next few weeks with the help of pain medication, muscle relaxers, and rest. Some patients report that a warm compress on the back of the neck or between the shoulder blades helps.  However, should you experience any of the  following, contact us  immediately: New numbness or weakness Pain that is progressively getting worse, and is not relieved by your pain medication, muscle relaxers, rest, and warm compresses Bleeding, redness, swelling, pain, or drainage from surgical incision Chills or flu-like symptoms Fever greater than 101.0 F (38.3 C) Inability to eat, drink fluids, or take medications Problems with bowel or bladder functions Difficulty breathing or shortness of  breath Warmth, tenderness, or swelling in your calf Contact Information How to contact us :  If you have any questions/concerns before or after surgery, you can reach us  at [phone], or you can send a mychart message. We can be reached by phone or mychart 8am-4pm, Monday-Friday.  *Please note: Calls after 4pm are forwarded to a third party answering service. Mychart messages are not routinely monitored during evenings, weekends, and holidays. Please call our office to contact the answering service for urgent concerns during non-business hours.   "

## 2024-01-17 NOTE — H&P (Signed)
 "   Referring Physician:  Claudene Penne ORN, MD [address] [place],  [place] [address]  Primary Physician:  Sherial Bail, MD  Discussed the use of AI scribe software for clinical note transcription with the patient, who gave verbal consent to proceed.  History of Present Illness Taylor Marshall is a 74 year old female with cervical radiculopathy who presents with worsening neck pain and muscle spasms. She has severe neck pain radiating to the left head, ear, and shoulder, with occasional right-sided pain. Pain is associated with random neck muscle spasms, worsened by movement, and she often holds her head down to limit spasms. She underwent a procedure on November 21, 2023 targeting the C3-C4 and C4-C5 nerves, which improved symptoms for about 1.5 days before the neck pain returned with greater intensity. She has difficulty breathing when lying on her back and episodes of food getting stuck while swallowing, and she is planned for a barium swallow study to evaluate these symptoms. She has had prior physical therapy and injections for her neck, most recently about 1.5 months ago, with only temporary benefit.  Conservative measures:  Multimodal medical therapy including regular antiinflammatories: tylenol , gabaoentin, meloxicam , methocarbamol  Injections: recent ct guided injections with no relief  Past Surgery: no spinal surgeries  The symptoms are causing a significant impact on the patient's life.   I have utilized the care everywhere function in epic to review the outside records available from external health systems.  Review of Systems:  A 10 point review of systems is negative, except for the pertinent positives and negatives detailed in the HPI.  Past Medical History: Past Medical History:  Diagnosis Date   Arthritis    GERD (gastroesophageal reflux disease)    Neuromuscular disorder [facility])     Past Surgical History: Past Surgical History:  Procedure  Laterality Date   BREAST EXCISIONAL BIOPSY Right    COLONOSCOPY  2006?   Dr Chipper   VAGINAL HYSTERECTOMY      Allergies: Allergies as of 12/12/2023 - Review Complete 12/12/2023  Allergen Reaction Noted   Egg solids, whole Diarrhea and Nausea Only 04/12/2023   Morphine  and codeine Nausea Only 11/27/2014   Buprenorphine hcl Nausea Only 12/16/2014    Medications:  Current Facility-Administered Medications:    ceFAZolin  (ANCEF ) IVPB 2g/100 mL premix, 2 g, Intravenous, Once, Claudene Penne ORN, MD   lactated ringers  infusion, , Intravenous, Continuous, Chesley Lendia CROME, MD  Social History: Social History   Tobacco Use   Smoking status: Former    Current packs/day: 1.00    Average packs/day: 1 pack/day for 20.0 years (20.0 ttl pk-yrs)    Types: E-cigarettes, Cigarettes   Smokeless tobacco: Never   Tobacco comments:    Quit cigarettes 2019    Uses vape (no nicotine per patient)  Vaping Use   Vaping status: Some Days  Substance Use Topics   Alcohol use: No    Alcohol/week: 0.0 standard drinks of alcohol   Drug use: No    Family Medical History: Family History  Problem Relation Age of Onset   Cancer Mother    Hypertension Mother    Diabetes Mother    Hypertension Father    Heart failure Father    Heart disease Father    Atrial fibrillation Father    Breast cancer Neg Hx     Physical Examination: Vitals:   01/17/24 0614  BP: (!) 127/97  Pulse: 95  Resp: 18  Temp: 98.4 F (36.9 C)  SpO2:  97%    General: Patient is in no apparent distress. Attention to examination is appropriate.  Neck:   Supple.  Full range of motion.  Respiratory: Patient is breathing without any difficulty.   NEUROLOGICAL:     Awake, alert, oriented to person, place, and time.  Speech is clear and fluent.   Cranial Nerves: Pupils equal round and reactive to light.  Facial tone is symmetric.  Facial sensation is symmetric. Shoulder shrug is symmetric. Tongue protrusion is midline.     Strength: Her proximal motor grading is somewhat difficult to give formal numbering 2 given her scapular abnormalities.  She has high riding scapula bilaterally.  She does have significant supra and infraspinatus wasting.  She also has minimal rhomboid mass.  She does activate her deltoids bilaterally, appears she can get out to at least 90 degrees, notably her scapula do not rotate internally full range of motion is quite limited.  She has weakness in external rotation bilaterally.  She has good handgrip and elbow flexion.  Imaging: Narrative & Impression  CLINICAL DATA:  Chronic neck pain   EXAM: MRI CERVICAL SPINE WITHOUT CONTRAST   TECHNIQUE: Multiplanar, multisequence MR imaging of the cervical spine was performed. No intravenous contrast was administered.   COMPARISON:  None Available.   FINDINGS: Alignment: Physiologic.   Vertebrae: Degenerative endplate marrow changes at a few levels. Partial interbody fusion at C7-T1. Areas of T1/T2 hyperintensity in lumbar vertebrae most consistent with intraosseous hemangioma or focal fat. No acute fracture is identified.   Cord: Normal signal and morphology.   Posterior Fossa, vertebral arteries, paraspinal tissues: The visualized portions of the skull base and the posterior fossa are normal. No soft tissue abnormality is identified.   Disc levels:   C2-C3: The disk is normal in configuration. Mild bilateral facet arthropathy. No uncovertebral joint disease. No neuroforaminal stenosis. No spinal canal stenosis.   C3-C4: Disc osteophyte complex. Mild bilateral facet arthropathy. Severe bilateral uncovertebral joint disease. Severe bilateral neuroforaminal stenosis. Moderate spinal canal stenosis.   C4-C5: Disc osteophyte complex. Mild bilateral facet arthropathy. Severe bilateral uncovertebral joint disease. Severe bilateral neuroforaminal stenosis. Moderate spinal canal stenosis.   C5-C6: Disc osteophyte complex. Mild  bilateral facet arthropathy. Moderate bilateral uncovertebral joint disease. Moderate bilateral neuroforaminal stenosis. No spinal canal stenosis.   C6-C7: The disk is normal in configuration. Mild bilateral facet arthropathy. Moderate right uncovertebral joint disease. Moderate right neuroforaminal stenosis. No spinal canal stenosis.   C7-T1: The disk is normal in configuration. No facet arthropathy. No uncovertebral joint disease. No neuroforaminal stenosis. No spinal canal stenosis.   IMPRESSION: 1. 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 Severe foraminal stenoses at these levels. 2. Moderate foraminal stenoses bilaterally at C5-C6 and on the right at C6-C7.     Electronically Signed   By: Clem Savory M.D.   On: 05/10/2023 10:12      I have personally reviewed the images and agree with the above interpretation.  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 Symptoms include neck pain, spasms, and radiation to the head, ear, and shoulder. Previous injection provided temporary relief. Current symptoms suggest nerve compression and cervical radiculopathy. Surgical intervention considered due to persistent symptoms and impact on quality of life. Risks of anterior approach include potential swallowing issues, hence preference for posterior approach. The procedure involves decompression of the foramina to relieve nerve pressure.  Her  MRI shows severe stenosis at C3-C5 bilaterally but her symptoms are left side predominant.  - Scheduled posterior MIS cervical foraminotomy(left sided) to decompress C3-C4 and C4-C5 foramina, risks and benefits were discussed, patient would like to go forward with the surgery. - Discussed potential need for anterior surgery if posterior approach is  insufficient.  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

## 2024-01-17 NOTE — Op Note (Signed)
 Indications: 74 y.o. 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   Procedure: left C3-5 Hemilaminectomy, medial facetectomy and foraminotomy, MIS   Preoperative Note:    Risks of surgery discussed include: infection, bleeding, stroke, coma, death, paralysis, CSF leak, nerve/spinal cord injury, numbness, tingling, weakness, complex regional pain syndrome, recurrent stenosis and/or disc herniation, vascular injury, development of instability, neck/back pain, need for further surgery, persistent symptoms, development of deformity, and the risks of anesthesia. They understood these risks and have agreed to proceed.   Operative Note:      The patient was then brought from the preoperative center with intravenous access established.  The patient underwent general anesthesia and endotracheal tube intubation, then was rotated on the [facility] table where all pressure points were appropriately padded.  An incision was marked with flouroscopy. The skin was then thoroughly cleansed.  Perioperative antibiotic prophylaxis was administered.  Sterile prep and drapes were then applied and a timeout was then observed.     The patient was brought to the operating room and placed under general endotracheal anesthesia. Preoperative antibiotics were administered. The patient was positioned  prone on a radiolucent table with the head secured in a levo head holder in neutral alignment. All pressure points were padded appropriately. Intraoperative fluoroscopy was used to localize the planned operative level.  A small paramedian skin incision measuring approximately 2.5 cm was made over the C3-5 level. Sequential dilators were advanced through the paraspinal musculature in a muscle-splitting fashion until the appropriate working corridor was established. A tubular retractor measuring 60mm was docked over the facet complex and secured in position. The operating microscope was brought into the field.  Using a high-speed drill and Kerrison rongeurs, a laminotomy and medial facetectomy were performed to expose the lateral recess and neural foramen. Hypertrophic bone and ligamentum flavum were removed in a stepwise manner. Care was taken to protect the dura, exiting and traversing nerve root at all times. The foramen was enlarged until the exiting C4 nerve root was fully decompressed and freely mobile. No cerebrospinal fluid leak was encountered.  Hemostasis was obtained with bipolar cautery and hemostatic agents. The tubular retractor was replaced under direct visualization to the next level.   Using a high-speed drill and Kerrison rongeurs, a laminotomy and medial facetectomy were performed to expose the lateral recess and neural foramen. Hypertrophic bone and ligamentum flavum were removed in a stepwise manner. Care was taken to protect the dura, exiting and traversing nerve root at all times. The foramen was enlarged until the exiting C5 nerve root was fully decompressed and freely mobile. No cerebrospinal fluid leak was encountered.  Depomedrol was placed on the nerve roots  The fascia was approximated with interrupted absorbable sutures, the subcutaneous layer was closed, and the skin was closed using Dermabond. A sterile dressing was applied. The patient was extubated in the operating room  and transferred to recovery in  stable condition.   Edsel Goods, [facility] was the assistant surgeon, they were needed for tissue retraction, suction, and protection of neural elements.    Penne MICAEL Sharps, MD

## 2024-01-17 NOTE — Progress Notes (Signed)
 PT Cancellation Note  Patient Details Name: Taylor Marshall MRN: [id number] DOB: [birth year]   Cancelled Treatment:    Reason Eval/Treat Not Completed: Other (comment). Pt with a lot of nausea this PM per RN, orders to transfer to the floor and not discharging today. PT to follow up as able.    Doyal Shams PT, DPT 1:10 PM,01/17/2024

## 2024-01-17 NOTE — Progress Notes (Signed)
" ° °  Neurosurgery Progress Note  History: Taylor Marshall is here for left C3-5 posterior cervical foraminotomies, MIS  POD1:  Physical Exam: Vitals:   01/17/24 1438 01/17/24 1631  BP: (!) 112/93 128/89  Pulse: 96 91  Resp: 18 18  Temp: 97.9 F (36.6 C) 98.1 F (36.7 C)  SpO2: 98% 100%    AA Ox3 CNI  Strength:5/5 throughout ***  Incision: ***  Data:  Other tests/results: ***  Assessment/Plan:  Taylor Marshall is a 74 year old female POD1 left C3-5 posterior cervical foraminotomies, MIS. She presented with worsening neck pain radiating to left head, ear, and shoulder with occasional right sided pain as well as muscle spasms.  - mobilize - pain control - DVT prophylaxis ***- PTOT  Tobey Libel, PA-S Department of Neurosurgery    "

## 2024-01-17 NOTE — Interval H&P Note (Signed)
 History and Physical Interval Note:  01/17/2024 7:07 AM  Taylor Marshall  has presented today for surgery, with the diagnosis of Left-sided cervical radiculopathy secondary to cervical foraminal stenosis.  The various methods of treatment have been discussed with the patient and family. After consideration of risks, benefits and other options for treatment, the patient has consented to  Procedures: C3-C5 posterior cervical foraminotomies, MIS (Left) as a surgical intervention.  The patient's history has been reviewed, patient examined, no change in status, stable for surgery.  I have reviewed the patient's chart and labs.  Questions were answered to the patient's satisfaction.    Heart and lungs clear   Penne LELON Sharps

## 2024-01-17 NOTE — Transfer of Care (Signed)
 Immediate Anesthesia Transfer of Care Note  Patient: Taylor Marshall  Procedure(s) Performed: C3-C5 posterior cervical foraminotomies, MIS (Left: Spine Cervical)  Patient Location: PACU  Anesthesia Type:General  Level of Consciousness: awake and sedated  Airway & Oxygen Therapy: Patient Spontanous Breathing and Patient connected to face mask oxygen  Post-op Assessment: Report given to RN and Post -op Vital signs reviewed and stable  Post vital signs: Reviewed and stable  Last Vitals:  Vitals Value Taken Time  BP 149/94 01/17/24 09:15  Temp 36.1 C 01/17/24 09:15  Pulse 84 01/17/24 09:17  Resp 23 01/17/24 09:17  SpO2 97 % 01/17/24 09:17  Vitals shown include unfiled device data.  Last Pain:  Vitals:   01/17/24 0614  TempSrc: Temporal  PainSc: 0-No pain         Complications: No notable events documented.

## 2024-01-17 NOTE — Progress Notes (Signed)
 OT Cancellation Note  Patient Details Name: [name] MRN: [id number] DOB: 1950-09-06   Cancelled Treatment:    Reason Eval/Treat Not Completed: Other (comment) (per discussion with PT, pt with nausea and plans to transfer patient to the floor. She not ready for OT at this time. OT will follow up as able.ALLIE Therisa Sheffield, OTD OTR/L  01/17/2024, 1:19 PM

## 2024-01-18 ENCOUNTER — Other Ambulatory Visit: Payer: Self-pay

## 2024-01-18 ENCOUNTER — Encounter: Payer: Self-pay | Admitting: Neurosurgery

## 2024-01-18 DIAGNOSIS — M4802 Spinal stenosis, cervical region: Secondary | ICD-10-CM | POA: Diagnosis not present

## 2024-01-18 NOTE — Evaluation (Signed)
 Occupational Therapy Evaluation Patient Details Name: Taylor Marshall MRN: [id number] DOB: 15-Apr-1950 Today's Date: 01/18/2024   History of Present Illness   Taylor Marshall is a 74 year old female with cervical radiculopathy who presents with worsening neck pain and muscle spasms. She has severe neck pain radiating to the left head, ear, and shoulder, with occasional right-sided pain. Patient is s/p C3-5 cervical foraminotomies. No brace needed.   Clinical Impressions [name] was seen for OT evaluation this date. Prior to hospital admission, pt was IND. Pt lives with spouse. Pt currently requires MIN cues standing grooming tasks - cues to maintain cervical pcns. IND in room mobility no AD use and LB dressing. Pt educated in functional application of cervical precautions and home/routines modifications. Pt verbalized understanding of all education/training provided. Handout provided to support recall and carry over of learned precautions/techniques. All education complete, will sign off. Upon hospital discharge, recommend Outpatient OT.    If plan is discharge home, recommend the following:   Assistance with cooking/housework     Functional Status Assessment   Patient has had a recent decline in their functional status and demonstrates the ability to make significant improvements in function in a reasonable and predictable amount of time.     Equipment Recommendations   None recommended by OT     Recommendations for Other Services         Precautions/Restrictions   Precautions Precautions: Fall Recall of Precautions/Restrictions: Intact Restrictions Weight Bearing Restrictions Per Provider Order: No     Mobility Bed Mobility               General bed mobility comments: not tested    Transfers Overall transfer level: Independent                 General transfer comment: no AD use      Balance Overall balance assessment: Independent                                          ADL either performed or assessed with clinical judgement   ADL Overall ADL's : Needs assistance/impaired                                       General ADL Comments: IND don pants. MIN cues standing grooming tasks - cues to maintain cervical pcns     Vision         Perception         Praxis         Pertinent Vitals/Pain Pain Assessment Pain Assessment: 0-10 Pain Score: 1  Pain Location: neck Pain Descriptors / Indicators: Sore, Discomfort Pain Intervention(s): Limited activity within patient's tolerance, Repositioned, Premedicated before session     Extremity/Trunk Assessment Upper Extremity Assessment Upper Extremity Assessment: Right hand dominant;RUE deficits/detail;LUE deficits/detail RUE Deficits / Details: shoulder flexion ~90* LUE Deficits / Details: shoulder flexion ~75*   Lower Extremity Assessment Lower Extremity Assessment: Overall WFL for tasks assessed   Cervical / Trunk Assessment Cervical / Trunk Assessment: Neck Surgery   Communication Communication Communication: No apparent difficulties   Cognition Arousal: Alert Behavior During Therapy: WFL for tasks assessed/performed Cognition: No apparent impairments  Following commands: Intact       Cueing  General Comments   Cueing Techniques: Verbal cues      Exercises     Shoulder Instructions      Home Living Family/patient expects to be discharged to:: Private residence Living Arrangements: Spouse/significant other Available Help at Discharge: Family;Available 24 hours/day Type of Home: House Home Access: Stairs to enter [employer] of Steps: 4 Entrance Stairs-Rails: Right Home Layout: Two level;Bed/bath upstairs   Alternate Level Stairs-Rails: Right           Home Equipment: None          Prior Functioning/Environment Prior Level of Function :  Independent/Modified Independent             Mobility Comments: no AD use at baseline ADLs Comments: 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    OT Problem List: Decreased range of motion   OT Treatment/Interventions:        OT Goals(Current goals can be found in the care plan section)   Acute Rehab OT Goals Patient Stated Goal: to go home OT Goal Formulation: With patient/family Time For Goal Achievement: 01/18/24 Potential to Achieve Goals: Good   OT Frequency:       Co-evaluation              AM-PAC OT 6 Clicks Daily Activity     Outcome Measure Help from another person eating meals?: None Help from another person taking care of personal grooming?: None Help from another person toileting, which includes using toliet, bedpan, or urinal?: None Help from another person bathing (including washing, rinsing, drying)?: None Help from another person to put on and taking off regular upper body clothing?: None Help from another person to put on and taking off regular lower body clothing?: None 6 Click Score: 24   End of Session    Activity Tolerance: Patient tolerated treatment well Patient left: in chair;with family/visitor present  OT Visit Diagnosis: Muscle weakness (generalized) (M62.81)                Time: 9044-8985 OT Time Calculation (min): 19 min Charges:  OT General Charges $OT Visit: 1 Visit OT Evaluation $OT Eval Low Complexity: 1 Low  Elston Slot, M.S. OTR/L  01/18/2024, 10:25 AM  ascom [phone]

## 2024-01-18 NOTE — Progress Notes (Signed)
 D/C AVS completed and reviewed with pt. All opportunities for questions answered and clarified. IV removed. Pt will be wheeled down to car at [facility] entrance via wheelchair. Meds delivered to pt by pharmacy

## 2024-01-18 NOTE — Evaluation (Signed)
 Physical Therapy Evaluation Patient Details Name: Taylor Marshall MRN: [id number] DOB: 08-09-1950 Today's Date: 01/18/2024  History of Present Illness  Taylor Marshall is a 74 year old female with cervical radiculopathy who presents with worsening neck pain and muscle spasms. She has severe neck pain radiating to the left head, ear, and shoulder, with occasional right-sided pain. Patient is s/p C3-5 cervical foraminotomies. No brace needed.  Clinical Impression  Patient received in bed, family at bedside. Patient is pleasant and agrees to PT assessment. She reports significant improvement in pain. 1/10. Patient instructed in log rolling for comfort. She is able to ambulate 200 feet and up/down 4 steps with 1 rail with supervision. No further acute PT needed at this time.         If plan is discharge home, recommend the following: A little help with bathing/dressing/bathroom   Can travel by private vehicle    yes    Equipment Recommendations None recommended by PT  Recommendations for Other Services       Functional Status Assessment Patient has had a recent decline in their functional status and demonstrates the ability to make significant improvements in function in a reasonable and predictable amount of time.     Precautions / Restrictions Precautions Precautions: Fall Recall of Precautions/Restrictions: Intact Restrictions Weight Bearing Restrictions Per Provider Order: No      Mobility  Bed Mobility Overal bed mobility: Needs Assistance Bed Mobility: Rolling, Sidelying to Sit, Sit to Sidelying Rolling: Supervision Sidelying to sit: Contact guard assist     Sit to sidelying: Supervision General bed mobility comments: patient demonstrates log rolling for pain reduction    Transfers Overall transfer level: Modified independent Equipment used: Rolling walker (2 wheels)               General transfer comment: cues needed for hand placement     Ambulation/Gait Ambulation/Gait assistance: Supervision Gait Distance (Feet): 200 Feet Assistive device: Rolling walker (2 wheels) Gait Pattern/deviations: Step-through pattern Gait velocity: WFL     General Gait Details: patient ambulated 100 feet with RW, then 100 feet without AD with supervision. No lob.  Stairs Stairs: Yes Stairs assistance: Supervision Stair Management: One rail Right, Forwards Number of Stairs: 4 General stair comments: patient safe on steps with supervision  Wheelchair Mobility     Tilt Bed    Modified Rankin (Stroke Patients Only)       Balance Overall balance assessment: Independent                                           Pertinent Vitals/Pain Pain Assessment Pain Assessment: 0-10 Pain Score: 1  Pain Location: neck Pain Descriptors / Indicators: Sore, Discomfort Pain Intervention(s): Monitored during session    Home Living Family/patient expects to be discharged to:: Private residence Living Arrangements: Spouse/significant other Available Help at Discharge: Family;Available 24 hours/day Type of Home: House Home Access: Stairs to enter Entrance Stairs-Rails: Right Entrance Stairs-Number of Steps: 4   Home Layout: Two level;Bed/bath upstairs        Prior Function Prior Level of Function : Independent/Modified Independent             Mobility Comments: no AD use at baseline ADLs Comments: independent at baseline     Extremity/Trunk Assessment   Upper Extremity Assessment Upper Extremity Assessment: Defer to OT evaluation    Lower Extremity Assessment Lower  Extremity Assessment: Overall WFL for tasks assessed    Cervical / Trunk Assessment Cervical / Trunk Assessment: Neck Surgery  Communication   Communication Communication: No apparent difficulties    Cognition Arousal: Alert Behavior During Therapy: WFL for tasks assessed/performed   PT - Cognitive impairments: No apparent  impairments                         Following commands: Intact       Cueing Cueing Techniques: Verbal cues     General Comments      Exercises     Assessment/Plan    PT Assessment All further PT needs can be met in the next venue of care  PT Problem List         PT Treatment Interventions      PT Goals (Current goals can be found in the Care Plan section)  Acute Rehab PT Goals Patient Stated Goal: return home today PT Goal Formulation: With patient/family Time For Goal Achievement: 01/20/24 Potential to Achieve Goals: Good    Frequency       Co-evaluation               AM-PAC PT 6 Clicks Mobility  Outcome Measure Help needed turning from your back to your side while in a flat bed without using bedrails?: None Help needed moving from lying on your back to sitting on the side of a flat bed without using bedrails?: A Little Help needed moving to and from a bed to a chair (including a wheelchair)?: None Help needed standing up from a chair using your arms (e.g., wheelchair or bedside chair)?: None Help needed to walk in hospital room?: None Help needed climbing 3-5 steps with a railing? : A Little 6 Click Score: 22    End of Session   Activity Tolerance: Patient tolerated treatment well Patient left: in bed;Other (comment);with nursing/sitter in room (sitting on side of bed) Nurse Communication: Mobility status PT Visit Diagnosis: Pain Pain - Right/Left: Left Pain - part of body: Shoulder (neck)    Time: 9096-9080 PT Time Calculation (min) (ACUTE ONLY): 16 min   Charges:   PT Evaluation $PT Eval Low Complexity: 1 Low   PT General Charges $$ ACUTE PT VISIT: 1 Visit         Syncere Kaminski, PT, GCS 01/18/2024,9:29 AM

## 2024-01-18 NOTE — Discharge Summary (Signed)
 Physician Discharge Summary  Patient ID: Taylor Marshall MRN: [id number] DOB/AGE: 1950-02-10 74 y.o.  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  Her intraoperative course was uncomplicated.  She was admitted overnight for pain control and some trouble breathing after anesthesia on postop day 0.  Her pain was well-controlled on oral medications.  Her radicular symptoms improved significantly.  She was able to be weaned off of oxygen and onto room air with no additional trouble breathing.  She was given prescriptions for pain medication and muscle relaxers.  Consults: None  Significant Diagnostic Studies: NA  Treatments: surgery: As above.  Please see separately dictated operative report for further details.  Discharge Exam: Blood pressure 120/80, pulse 79, temperature 98.5 F (36.9 C), resp. rate 16, SpO2 99%.  AA Ox3 CNI   Strength: 5/5 with the exception of left wrist extension which was 4-.   Incision: incision is clean and dry with clean dressing in place  Disposition: Discharge disposition: 01-Home or Self Care       Discharge Instructions     Incentive spirometry RT   Complete by: As directed    Remove dressing in 48 hours   Complete by: As directed       Allergies as of 01/18/2024       Reactions   Egg Solids, Whole Diarrhea, Nausea Only   Morphine  And Codeine Nausea Only   Buprenorphine Hcl Nausea Only        Medication List     STOP taking these medications    amLODipine 2.5 MG  tablet Commonly known as: NORVASC   DULoxetine 30 MG capsule Commonly known as: CYMBALTA   ibuprofen 200 MG tablet Commonly known as: ADVIL   meloxicam  15 MG tablet Commonly known as: MOBIC        TAKE these medications    albuterol  108 (90 Base) MCG/ACT inhaler Commonly known as: VENTOLIN  HFA Inhale 2 puffs into the lungs every 6 (six) hours as needed for wheezing or shortness of breath.   CALCIUM PO Take 500 mg by mouth daily. gummies   chlorhexidine  4 % external liquid Commonly known as: HIBICLENS  Apply 15 mLs (1 Application total) topically as directed for 30 doses. Use as directed daily for 5 days every other week for 6 weeks.   gabapentin  300 MG capsule Commonly known as: NEURONTIN  Take 600 mg by mouth See admin instructions. 2 tabs in the morning, 1 in the afternoon, and 1 at night.  Will occasionally take a 5th capsule if needed   HM COQ-10 PO Take 200 mg by mouth at bedtime. gummies   MAGNESIUM  PO Take 500 mg by mouth daily as needed.   methocarbamol  500 MG tablet Commonly known as: ROBAXIN  Take 1 tablet (500 mg total) by mouth every 6 (six) hours as needed for muscle spasms. What changed: when to take this   mupirocin  ointment 2 % Commonly known as: BACTROBAN  Place 1 Application into the nose 2 (two) times daily for 60 doses. Use as directed 2 times daily for 5 days every other week for 6 weeks.   naproxen sodium  220 MG tablet Commonly known as: ALEVE Take 660 mg by mouth See admin instructions. Takes in the morning, afternoon, bedtime, and if woken up in the middle of the night she takes 660mg    omeprazole 20 MG capsule Commonly known as: PRILOSEC Take 20 mg by mouth daily.   oxyCODONE  5 MG immediate release tablet Commonly known as: Roxicodone  Take 1 tablet (5 mg total) by mouth every 4 (four) hours as needed for up to 5 days for severe pain (pain score 7-10).   senna 8.6 MG Tabs tablet Commonly known as: SENOKOT Take 1 tablet (8.6 mg total) by  mouth 2 (two) times daily as needed for mild constipation.   VITAMIN B12 PO Take 2,000 mcg by mouth daily.        Follow-up Information     Ulis Bottcher, [place]. Go on 01/31/2024.   Specialty: Physician Assistant Why: 3:00 pm Contact information: [address] [place], [address] [place] [address] [phone]                 Signed: Edsel Jama Goods 01/18/2024, 10:41 AM

## 2024-01-18 NOTE — Anesthesia Postprocedure Evaluation (Signed)
"   Anesthesia Post Note  Patient: Taylor Marshall  Procedure(s) Performed: C3-C5 posterior cervical foraminotomies, MIS (Left: Spine Cervical)  Patient location during evaluation: PACU Anesthesia Type: General Level of consciousness: awake and alert Pain management: pain level controlled Vital Signs Assessment: post-procedure vital signs reviewed and stable Respiratory status: spontaneous breathing, nonlabored ventilation, respiratory function stable and patient connected to nasal cannula oxygen Cardiovascular status: blood pressure returned to baseline and stable Postop Assessment: no apparent nausea or vomiting Anesthetic complications: no   No notable events documented.   Last Vitals:  Vitals:   01/18/24 0511 01/18/24 0837  BP: 120/82 120/80  Pulse: 92 79  Resp: 17 16  Temp: 36.7 C 36.9 C  SpO2: 97% 99%    Last Pain:  Vitals:   01/18/24 0745  TempSrc:   PainSc: 2                  [name]      "

## 2024-01-23 ENCOUNTER — Telehealth: Payer: Self-pay | Admitting: Neurosurgery

## 2024-01-23 NOTE — Telephone Encounter (Signed)
 Patient is calling to request a letter stating that she is unable to wear a seatbelt across her shoulder from her recent surgery. She states that she is able to put it under her arm. She would like to pick this up when ready.

## 2024-01-24 ENCOUNTER — Telehealth: Payer: Self-pay | Admitting: Physician Assistant

## 2024-01-24 NOTE — Telephone Encounter (Signed)
 01/17/23, C3-5 posterior cervical foraminotomies  She is doing well postoperatively, she has expected pain in between her shoulder blades.  She is calling to ask for a letter stating that she can wear the strap of her seatbelt underneath her arm so it does not go across her shoulder.  I have stated that I am not comfortable writing a letter that nature at this time.

## 2024-01-26 ENCOUNTER — Ambulatory Visit: Payer: Medicare (Managed Care) | Admitting: Physician Assistant

## 2024-01-26 ENCOUNTER — Encounter: Payer: Self-pay | Admitting: Physician Assistant

## 2024-01-26 VITALS — BP 110/70

## 2024-01-26 DIAGNOSIS — Z09 Encounter for follow-up examination after completed treatment for conditions other than malignant neoplasm: Secondary | ICD-10-CM

## 2024-01-26 DIAGNOSIS — M4802 Spinal stenosis, cervical region: Secondary | ICD-10-CM

## 2024-01-26 NOTE — Progress Notes (Signed)
" ° °  REFERRING PHYSICIAN:  Sherial Bail, [place] [address] [place],  [place] [address]  DOS: 01/17/24, C3-5 posterior cervical foraminotomies  HISTORY OF PRESENT ILLNESS: Taylor Marshall is 2 weeks status post C3-5 posterior cervical foraminotomies. Overall, she is doing very well.  Her radiculopathy pain has improved quite a bit.  She does have some expected stiffness.  She is been using gabapentin  and Robaxin  for pain.    PHYSICAL EXAMINATION:  NEUROLOGICAL:  General: In no acute distress.   Awake, alert, oriented to person, place, and time.  Pupils equal round and reactive to light.  Facial tone is symmetric.    Strength: Patient does have baseline scapular abnormalities which makes it hard for formal grading.  However her strength does appear to be at baseline and without pain.   Incision c/d/I  Imaging:  No interval imaging  Assessment / Plan: SULAY BRYMER is doing well after C3-5 posterior cervical foraminotomies . We discussed activity escalation and I have advised the patient to lift up to 10 pounds until 6 weeks after surgery, then increase up to 25 pounds until 12 weeks after surgery.  After 12 weeks post-op, the patient advised to increase activity as tolerated. she will return to clinic in approximately 1 month for her 6-week postop visit.  She was counseled on activity escalation.    Advised to contact the office if any questions or concerns arise.   Lyle Decamp PA-C Dept of Neurosurgery   "

## 2024-01-31 ENCOUNTER — Encounter: Admitting: Physician Assistant

## 2024-02-27 ENCOUNTER — Encounter

## 2024-04-02 ENCOUNTER — Encounter

## 2024-04-13 ENCOUNTER — Ambulatory Visit: Payer: Medicare (Managed Care) | Admitting: Pulmonary Disease
